# Patient Record
Sex: Female | Born: 1995 | Race: White | Hispanic: No | Smoking: Former smoker
Health system: Southern US, Community
[De-identification: ages and names within clinical notes are randomized; demographics above are authoritative.]

## PROBLEM LIST (undated history)

## (undated) DIAGNOSIS — F419 Anxiety disorder, unspecified: Secondary | ICD-10-CM

## (undated) DIAGNOSIS — F32A Depression, unspecified: Secondary | ICD-10-CM

## (undated) DIAGNOSIS — F329 Major depressive disorder, single episode, unspecified: Secondary | ICD-10-CM

## (undated) DIAGNOSIS — G43909 Migraine, unspecified, not intractable, without status migrainosus: Secondary | ICD-10-CM

## (undated) DIAGNOSIS — J9801 Acute bronchospasm: Secondary | ICD-10-CM

## (undated) HISTORY — PX: WISDOM TOOTH EXTRACTION: SHX21

## (undated) HISTORY — DX: Migraine, unspecified, not intractable, without status migrainosus: G43.909

## (undated) HISTORY — PX: DILATION AND CURETTAGE OF UTERUS: SHX78

## (undated) HISTORY — PX: TONSILLECTOMY: SUR1361

---
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
## 2014-04-05 ENCOUNTER — Encounter (HOSPITAL_BASED_OUTPATIENT_CLINIC_OR_DEPARTMENT_OTHER): Payer: Self-pay | Admitting: Emergency Medicine

## 2014-04-05 ENCOUNTER — Emergency Department (HOSPITAL_BASED_OUTPATIENT_CLINIC_OR_DEPARTMENT_OTHER)
Admission: EM | Admit: 2014-04-05 | Discharge: 2014-04-05 | Disposition: A | Discharge status: Home / Self Care | Payer: Medicaid - Out of State | Attending: Emergency Medicine | Admitting: Emergency Medicine

## 2014-04-05 DIAGNOSIS — Z8709 Personal history of other diseases of the respiratory system: Secondary | ICD-10-CM | POA: Insufficient documentation

## 2014-04-05 DIAGNOSIS — R197 Diarrhea, unspecified: Secondary | ICD-10-CM | POA: Insufficient documentation

## 2014-04-05 DIAGNOSIS — Z87891 Personal history of nicotine dependence: Secondary | ICD-10-CM | POA: Insufficient documentation

## 2014-04-05 DIAGNOSIS — Z3202 Encounter for pregnancy test, result negative: Secondary | ICD-10-CM | POA: Insufficient documentation

## 2014-04-05 DIAGNOSIS — Z79899 Other long term (current) drug therapy: Secondary | ICD-10-CM | POA: Insufficient documentation

## 2014-04-05 DIAGNOSIS — G43909 Migraine, unspecified, not intractable, without status migrainosus: Secondary | ICD-10-CM | POA: Insufficient documentation

## 2014-04-05 DIAGNOSIS — R42 Dizziness and giddiness: Secondary | ICD-10-CM | POA: Insufficient documentation

## 2014-04-05 LAB — URINALYSIS, ROUTINE W REFLEX MICROSCOPIC
Bilirubin Urine: NEGATIVE
Glucose, UA: NEGATIVE mg/dL
Hgb urine dipstick: NEGATIVE
Ketones, ur: NEGATIVE mg/dL
Leukocytes, UA: NEGATIVE
Nitrite: NEGATIVE
Protein, ur: NEGATIVE mg/dL
Specific Gravity, Urine: 1.007 (ref 1.005–1.030)
Urobilinogen, UA: 0.2 mg/dL (ref 0.0–1.0)
pH: 6.5 (ref 5.0–8.0)

## 2014-04-05 LAB — PREGNANCY, URINE: Preg Test, Ur: NEGATIVE

## 2014-04-05 MED ORDER — SODIUM CHLORIDE 0.9 % IV BOLUS (SEPSIS)
1000.0000 mL | Freq: Once | INTRAVENOUS | Status: AC
  Administered 2014-04-05: 1000 mL via INTRAVENOUS

## 2014-04-05 MED ORDER — DIPHENHYDRAMINE HCL 50 MG/ML IJ SOLN
25.0000 mg | Freq: Once | INTRAMUSCULAR | Status: AC
  Administered 2014-04-05: 25 mg via INTRAVENOUS
  Filled 2014-04-05: qty 1

## 2014-04-05 MED ORDER — METOCLOPRAMIDE HCL 5 MG/ML IJ SOLN
10.0000 mg | Freq: Once | INTRAMUSCULAR | Status: AC
  Administered 2014-04-05: 10 mg via INTRAVENOUS
  Filled 2014-04-05: qty 2

## 2014-04-05 MED ORDER — DEXAMETHASONE SODIUM PHOSPHATE 10 MG/ML IJ SOLN
10.0000 mg | Freq: Once | INTRAMUSCULAR | Status: AC
  Administered 2014-04-05: 10 mg via INTRAVENOUS
  Filled 2014-04-05: qty 1

## 2014-04-05 NOTE — ED Notes (Signed)
1.  Reports blurred vision when waking up this am.  Reports headache, nausea, continued blurred vision t/o the day even though she took a [employer], Aleve.  Reports onset of vomiting with a blood nose afterwards.  2.  Nexplanon placed 02/26/14 after a negative pregnancy test.  Had her period one week ago.  Concerned that some 'fleshy tissue' passed and she had some irregular bleeding.

## 2014-04-05 NOTE — ED Provider Notes (Signed)
CSN: [id number]     Arrival date & time 04/05/14  1522 History  This chart was scribed for Rolan Bucco, MD by Nicholos Johns, ED scribe. This patient was seen in room MH05/MH05 and the patient's care was started at 3:45 PM.   Chief Complaint  Patient presents with  . Headache   The history is provided by the patient. No language interpreter was used.   HPI Comments: Emily Briggs is a 18 y.o. female who presents to the Emergency Department complaining of HA located along the temporal and frontal lobes w/ associated blurred vision, nausea, and dizziness; onset this morning. Some intermittent diarrhea as well. States she woke up this morning and her vision was blurry. States this usually how she knows she is getting a migraine. Took a goody powder which normally provides relief in 15-20 minutes but states this did not provide relief. Went to work and tried to go about her day but still felt sick. Went to the bathroom and vomited. Went to wipe her mouth and nose and states there was blood everywhere. Bleeding stopped immediately after vomiting. States she usually does not vomit when she gets migraines. Pt received a Nexplanon implant in [place] 02/26/14 following a negative pregnancy test. States he had her period last week but was concerned after some fleshy tissue passed and bleeding was irregular. Denies any other recent trauma or injury. Denies abdominal pain, vaginal discharge, neck pain, fever, or  photophobia. Says that this feels like same type of pain as her typical migraines.  Past Medical History  Diagnosis Date  . Bronchial spasm    History reviewed. No pertinent past surgical history. No family history on file. History  Substance Use Topics  . Smoking status: Former Smoker    Quit date: 10/31/2011  . Smokeless tobacco: Not on file  . Alcohol Use: No   OB History   Grav Para Term Preterm Abortions TAB SAB Ect Mult Living                 Review of Systems  Constitutional:  Negative for fever, chills, diaphoresis and fatigue.  HENT: Negative for congestion, nosebleeds, rhinorrhea and sneezing.   Eyes: Positive for visual disturbance. Negative for photophobia.  Respiratory: Negative for cough, chest tightness and shortness of breath.   Cardiovascular: Negative for chest pain and leg swelling.  Gastrointestinal: Positive for nausea, vomiting and diarrhea. Negative for abdominal pain and blood in stool.  Genitourinary: Negative for frequency, hematuria, flank pain, vaginal discharge and difficulty urinating.  Musculoskeletal: Negative for arthralgias, back pain and neck pain.  Skin: Negative for rash.  Neurological: Positive for dizziness and headaches. Negative for speech difficulty, weakness and numbness.   Allergies  Sulfa drugs cross reactors  Home Medications   Prior to Admission medications   Medication Sig Start Date End Date Taking? Authorizing Provider  dextromethorphan (DELSYM) 30 MG/5ML liquid Take 60 mg by mouth 2 (two) times daily as needed. For cough    Historical Provider, MD  guaiFENesin (ROBITUSSIN) 100 MG/5ML SOLN Take 10 mLs by mouth every 4 (four) hours as needed. For cough/congestion    Historical Provider, MD   Triage Vitals: BP 125/63  Pulse 68  Temp(Src) 98.8 F (37.1 C) (Oral)  Resp 18  Ht 5\' 5"  (1.651 m)  Wt 130 lb (58.968 kg)  BMI 21.63 kg/m2  SpO2 100%  LMP 03/25/2014 Physical Exam  Nursing note and vitals reviewed. Constitutional: She is oriented to person, place, and time. She appears well-developed and  well-nourished.  HENT:  Head: Normocephalic and atraumatic.  Eyes: Pupils are equal, round, and reactive to light.  Fundi not well visualized. No photophobia.   Neck: Normal range of motion. Neck supple.  No neck stiffness.  Cardiovascular: Normal rate, regular rhythm and normal heart sounds.   Pulmonary/Chest: Effort normal and breath sounds normal. No respiratory distress. She has no wheezes. She has no rales. She  exhibits no tenderness.  Abdominal: Soft. Bowel sounds are normal. There is no tenderness. There is no rebound and no guarding.  Musculoskeletal: Normal range of motion. She exhibits no edema.  Lymphadenopathy:    She has no cervical adenopathy.  Neurological: She is alert and oriented to person, place, and time. She has normal strength. No cranial nerve deficit or sensory deficit.  Finger to nose intact. No pronator drift.  Skin: Skin is warm and dry. No rash noted.  Psychiatric: She has a normal mood and affect.   ED Course  Procedures (including critical care time) DIAGNOSTIC STUDIES: Oxygen Saturation is 100% on room air, normal by my interpretation.    COORDINATION OF CARE: At 3:48 PM: Discussed treatment plan with patient which includes migraine cocktail and IV fluids. Patient agrees.   Results for orders placed during the hospital encounter of 04/05/14  PREGNANCY, URINE      Result Value Ref Range   Preg Test, Ur NEGATIVE  NEGATIVE  URINALYSIS, ROUTINE W REFLEX MICROSCOPIC      Result Value Ref Range   Color, Urine YELLOW  YELLOW   APPearance CLEAR  CLEAR   Specific Gravity, Urine 1.007  1.005 - 1.030   pH 6.5  5.0 - 8.0   Glucose, UA NEGATIVE  NEGATIVE mg/dL   Hgb urine dipstick NEGATIVE  NEGATIVE   Bilirubin Urine NEGATIVE  NEGATIVE   Ketones, ur NEGATIVE  NEGATIVE mg/dL   Protein, ur NEGATIVE  NEGATIVE mg/dL   Urobilinogen, UA 0.2  0.0 - 1.0 mg/dL   Nitrite NEGATIVE  NEGATIVE   Leukocytes, UA NEGATIVE  NEGATIVE   Labs Review Labs Reviewed  PREGNANCY, URINE  URINALYSIS, ROUTINE W REFLEX MICROSCOPIC    Imaging Review No results found.   EKG Interpretation None      MDM   Final diagnoses:  Migraine   Patient was given migraine cocktail. Her headache is completely resolved after this. She has no symptoms that would be more suggestive of subarachnoid hemorrhage or meningitis. Her urine pregnancy is negative. She was discharged in good condition. I gave her  an outpatient referral to follow up with neurology for migraines continue. I advised to return here if she has any worsening symptoms or unusual headaches.  I personally performed the services described in this documentation, which was scribed in my presence.  The recorded information has been reviewed and considered.      Rolan BuccoMelanie Lashya Passe, MD 04/05/14 [phone]

## 2014-04-05 NOTE — Discharge Instructions (Signed)
Migraine Headache A migraine headache is an intense, throbbing pain on one or both sides of your head. A migraine can last for 30 minutes to several hours. CAUSES  The exact cause of a migraine headache is not always known. However, a migraine may be caused when nerves in the brain become irritated and release chemicals that cause inflammation. This causes pain. Certain things may also trigger migraines, such as:  Alcohol.  Smoking.  Stress.  Menstruation.  Aged cheeses.  Foods or drinks that contain nitrates, glutamate, aspartame, or tyramine.  Lack of sleep.  Chocolate.  Caffeine.  Hunger.  Physical exertion.  Fatigue.  Medicines used to treat chest pain (nitroglycerine), birth control pills, estrogen, and some blood pressure medicines. SIGNS AND SYMPTOMS  Pain on one or both sides of your head.  Pulsating or throbbing pain.  Severe pain that prevents daily activities.  Pain that is aggravated by any physical activity.  Nausea, vomiting, or both.  Dizziness.  Pain with exposure to bright lights, loud noises, or activity.  General sensitivity to bright lights, loud noises, or smells. Before you get a migraine, you may get warning signs that a migraine is coming (aura). An aura may include:  Seeing flashing lights.  Seeing bright spots, halos, or zig-zag lines.  Having tunnel vision or blurred vision.  Having feelings of numbness or tingling.  Having trouble talking.  Having muscle weakness. DIAGNOSIS  A migraine headache is often diagnosed based on:  Symptoms.  Physical exam.  A CT scan or MRI of your head. These imaging tests cannot diagnose migraines, but they can help rule out other causes of headaches. TREATMENT Medicines may be given for pain and nausea. Medicines can also be given to help prevent recurrent migraines.  HOME CARE INSTRUCTIONS  Only take over-the-counter or prescription medicines for pain or discomfort as directed by your  health care provider. The use of long-term narcotics is not recommended.  Lie down in a dark, quiet room when you have a migraine.  Keep a journal to find out what may trigger your migraine headaches. For example, write down:  What you eat and drink.  How much sleep you get.  Any change to your diet or medicines.  Limit alcohol consumption.  Quit smoking if you smoke.  Get 7 9 hours of sleep, or as recommended by your health care provider.  Limit stress.  Keep lights dim if bright lights bother you and make your migraines worse. SEEK IMMEDIATE MEDICAL CARE IF:   Your migraine becomes severe.  You have a fever.  You have a stiff neck.  You have vision loss.  You have muscular weakness or loss of muscle control.  You start losing your balance or have trouble walking.  You feel faint or pass out.  You have severe symptoms that are different from your first symptoms. MAKE SURE YOU:   Understand these instructions.  Will watch your condition.  Will get help right away if you are not doing well or get worse. Document Released: 10/31/2005 Document Revised: 08/21/2013 Document Reviewed: 07/08/2013 [employer] Patient Information 2014 [employer].  

## 2014-05-20 ENCOUNTER — Emergency Department (HOSPITAL_BASED_OUTPATIENT_CLINIC_OR_DEPARTMENT_OTHER)
Admission: EM | Admit: 2014-05-20 | Discharge: 2014-05-20 | Disposition: A | Discharge status: Home / Self Care | Payer: Medicaid - Out of State | Attending: Emergency Medicine | Admitting: Emergency Medicine

## 2014-05-20 ENCOUNTER — Emergency Department (HOSPITAL_BASED_OUTPATIENT_CLINIC_OR_DEPARTMENT_OTHER): Payer: Medicaid - Out of State

## 2014-05-20 ENCOUNTER — Encounter (HOSPITAL_BASED_OUTPATIENT_CLINIC_OR_DEPARTMENT_OTHER): Payer: Self-pay | Admitting: Emergency Medicine

## 2014-05-20 DIAGNOSIS — J40 Bronchitis, not specified as acute or chronic: Secondary | ICD-10-CM

## 2014-05-20 DIAGNOSIS — R059 Cough, unspecified: Secondary | ICD-10-CM | POA: Diagnosis present

## 2014-05-20 DIAGNOSIS — F17200 Nicotine dependence, unspecified, uncomplicated: Secondary | ICD-10-CM | POA: Insufficient documentation

## 2014-05-20 DIAGNOSIS — R05 Cough: Secondary | ICD-10-CM | POA: Diagnosis present

## 2014-05-20 DIAGNOSIS — N390 Urinary tract infection, site not specified: Secondary | ICD-10-CM | POA: Insufficient documentation

## 2014-05-20 DIAGNOSIS — Z3202 Encounter for pregnancy test, result negative: Secondary | ICD-10-CM | POA: Insufficient documentation

## 2014-05-20 DIAGNOSIS — J209 Acute bronchitis, unspecified: Secondary | ICD-10-CM | POA: Diagnosis not present

## 2014-05-20 LAB — URINALYSIS, ROUTINE W REFLEX MICROSCOPIC
Bilirubin Urine: NEGATIVE
Glucose, UA: NEGATIVE mg/dL
Hgb urine dipstick: NEGATIVE
Ketones, ur: NEGATIVE mg/dL
Leukocytes, UA: NEGATIVE
Nitrite: POSITIVE — AB
Protein, ur: NEGATIVE mg/dL
Specific Gravity, Urine: 1.03 (ref 1.005–1.030)
Urobilinogen, UA: 0.2 mg/dL (ref 0.0–1.0)
pH: 5.5 (ref 5.0–8.0)

## 2014-05-20 LAB — URINE MICROSCOPIC-ADD ON

## 2014-05-20 LAB — PREGNANCY, URINE: Preg Test, Ur: NEGATIVE

## 2014-05-20 MED ORDER — ALBUTEROL SULFATE HFA 108 (90 BASE) MCG/ACT IN AERS: 1.0000 | INHALATION_SPRAY | Freq: Four times a day (QID) | RESPIRATORY_TRACT | Status: DC | PRN

## 2014-05-20 MED ORDER — CEPHALEXIN 250 MG PO CAPS
500.0000 mg | ORAL_CAPSULE | Freq: Once | ORAL | Status: AC
  Administered 2014-05-20: 500 mg via ORAL
  Filled 2014-05-20: qty 2

## 2014-05-20 MED ORDER — CEPHALEXIN 500 MG PO CAPS
500.0000 mg | ORAL_CAPSULE | Freq: Four times a day (QID) | ORAL | Status: DC
Start: 2014-05-20 — End: 2014-09-22

## 2014-05-20 NOTE — Discharge Instructions (Signed)
Bronchitis Bronchitis is inflammation of the airways that extend from the windpipe into the lungs (bronchi). The inflammation often causes mucus to develop, which leads to a cough. If the inflammation becomes severe, it may cause shortness of breath. CAUSES  Bronchitis may be caused by:   Viral infections.   Bacteria.   Cigarette smoke.   Allergens, pollutants, and other irritants.  SIGNS AND SYMPTOMS  The most common symptom of bronchitis is a frequent cough that produces mucus. Other symptoms include:  Fever.   Body aches.   Chest congestion.   Chills.   Shortness of breath.   Sore throat.  DIAGNOSIS  Bronchitis is usually diagnosed through a medical history and physical exam. Tests, such as chest X-rays, are sometimes done to rule out other conditions.  TREATMENT  You may need to avoid contact with whatever caused the problem (smoking, for example). Medicines are sometimes needed. These may include:  Antibiotics. These may be prescribed if the condition is caused by bacteria.  Cough suppressants. These may be prescribed for relief of cough symptoms.   Inhaled medicines. These may be prescribed to help open your airways and make it easier for you to breathe.   Steroid medicines. These may be prescribed for those with recurrent (chronic) bronchitis. HOME CARE INSTRUCTIONS  Get plenty of rest.   Drink enough fluids to keep your urine clear or pale yellow (unless you have a medical condition that requires fluid restriction). Increasing fluids may help thin your secretions and will prevent dehydration.   Only take over-the-counter or prescription medicines as directed by your health care provider.  Only take antibiotics as directed. Make sure you finish them even if you start to feel better.  Avoid secondhand smoke, irritating chemicals, and strong fumes. These will make bronchitis worse. If you are a smoker, quit smoking. Consider using nicotine gum or  skin patches to help control withdrawal symptoms. Quitting smoking will help your lungs heal faster.   Put a cool-mist humidifier in your bedroom at night to moisten the air. This may help loosen mucus. Change the water in the humidifier daily. You can also run the hot water in your shower and sit in the bathroom with the door closed for 5-10 minutes.   Follow up with your health care provider as directed.   Wash your hands frequently to avoid catching bronchitis again or spreading an infection to others.  SEEK MEDICAL CARE IF: Your symptoms do not improve after 1 week of treatment.  SEEK IMMEDIATE MEDICAL CARE IF:  Your fever increases.  You have chills.   You have chest pain.   You have worsening shortness of breath.   You have bloody sputum.  You faint.  You have lightheadedness.  You have a severe headache.   You vomit repeatedly. MAKE SURE YOU:   Understand these instructions.  Will watch your condition.  Will get help right away if you are not doing well or get worse. Document Released: 10/31/2005 Document Revised: 08/21/2013 Document Reviewed: 06/25/2013 [facility] Patient Information 2015 [place], [place]. This information is not intended to replace advice given to you by your health care provider. Make sure you discuss any questions you have with your health care provider.  For the urinary tract infection take the Keflex as directed. This may also help for cough. Use albuterol inhaler 2 puffs every 6 hours for the next 7 days then as needed. Okay to continue Mucinex DM. Would recommend a trial of Zyrtec in case this is allergy  base for the cough. Chest x-ray was negative.

## 2014-05-20 NOTE — ED Provider Notes (Signed)
CSN: [id number]     Arrival date & time 05/20/14  1014 History   First MD Initiated Contact with Patient 05/20/14 1037     Chief Complaint  Patient presents with  . Cough     (Consider location/radiation/quality/duration/timing/severity/associated sxs/prior Treatment) Patient is a 18 y.o. female presenting with cough. The history is provided by the patient.  Cough Associated symptoms: chest pain   Associated symptoms: no fever, no headaches and no sore throat    patient with a two-week history of cold and cough. One week history of dysuria. Patient's been using Mucinex and Robitussin. No significant improvement in the cough. No fevers. No nausea vomiting no bowel pain. Chest soreness due to coughing.  Past Medical History  Diagnosis Date  . Bronchial spasm    Past Surgical History  Procedure Laterality Date  . Tonsillectomy     No family history on file. History  Substance Use Topics  . Smoking status: Current Every Day Smoker -- 0.50 packs/day    Types: Cigarettes    Last Attempt to Quit: 10/31/2011  . Smokeless tobacco: Not on file  . Alcohol Use: No   OB History   Grav Para Term Preterm Abortions TAB SAB Ect Mult Living                 Review of Systems  Constitutional: Negative for fever.  HENT: Positive for congestion. Negative for sore throat.   Eyes: Negative for visual disturbance.  Respiratory: Positive for cough.   Cardiovascular: Positive for chest pain.  Gastrointestinal: Negative for nausea, vomiting and abdominal pain.  Genitourinary: Positive for dysuria.  Musculoskeletal: Negative for back pain.  Neurological: Negative for headaches.  Hematological: Does not bruise/bleed easily.  Psychiatric/Behavioral: Negative for confusion.      Allergies  Sulfa drugs cross reactors  Home Medications   Prior to Admission medications   Medication Sig Start Date End Date Taking? Authorizing Provider  albuterol (PROVENTIL HFA;VENTOLIN HFA) 108 (90 BASE)  MCG/ACT inhaler Inhale 1-2 puffs into the lungs every 6 (six) hours as needed for wheezing or shortness of breath. 05/20/14   Vanetta MuldersScott Hamdi Kley, MD  cephALEXin (KEFLEX) 500 MG capsule Take 1 capsule (500 mg total) by mouth 4 (four) times daily. 05/20/14   Vanetta MuldersScott Ramil Edgington, MD  dextromethorphan (DELSYM) 30 MG/5ML liquid Take 60 mg by mouth 2 (two) times daily as needed. For cough    Historical Provider, MD  guaiFENesin (ROBITUSSIN) 100 MG/5ML SOLN Take 10 mLs by mouth every 4 (four) hours as needed. For cough/congestion    Historical Provider, MD   BP 124/69  Pulse 54  Temp(Src) 98.9 F (37.2 C) (Oral)  Resp 16  Ht 5\' 5"  (1.651 m)  Wt 135 lb (61.236 kg)  BMI 22.47 kg/m2  SpO2 97% Physical Exam  Nursing note and vitals reviewed. Constitutional: She is oriented to person, place, and time. She appears well-developed and well-nourished. No distress.  HENT:  Head: Normocephalic and atraumatic.  Mouth/Throat: Oropharynx is clear and moist. No oropharyngeal exudate.  Eyes: Conjunctivae and EOM are normal. Pupils are equal, round, and reactive to light.  Neck: Normal range of motion. Neck supple.  Cardiovascular: Normal rate, regular rhythm and normal heart sounds.   Pulmonary/Chest: Effort normal and breath sounds normal. No respiratory distress.  Abdominal: Soft. Bowel sounds are normal. There is no tenderness.  Musculoskeletal: Normal range of motion. She exhibits no edema.  Neurological: She is alert and oriented to person, place, and time. No cranial nerve deficit. She exhibits  normal muscle tone. Coordination normal.  Skin: Skin is warm. No rash noted.    ED Course  Procedures (including critical care time) Labs Review Labs Reviewed  URINALYSIS, ROUTINE W REFLEX MICROSCOPIC - Abnormal; Notable for the following:    APPearance CLOUDY (*)    Nitrite POSITIVE (*)    All other components within normal limits  URINE MICROSCOPIC-ADD ON - Abnormal; Notable for the following:    Squamous  Epithelial / LPF FEW (*)    Bacteria, UA MANY (*)    All other components within normal limits  PREGNANCY, URINE   Results for orders placed during the hospital encounter of 05/20/14  PREGNANCY, URINE      Result Value Ref Range   Preg Test, Ur NEGATIVE  NEGATIVE  URINALYSIS, ROUTINE W REFLEX MICROSCOPIC      Result Value Ref Range   Color, Urine YELLOW  YELLOW   APPearance CLOUDY (*) CLEAR   Specific Gravity, Urine 1.030  1.005 - 1.030   pH 5.5  5.0 - 8.0   Glucose, UA NEGATIVE  NEGATIVE mg/dL   Hgb urine dipstick NEGATIVE  NEGATIVE   Bilirubin Urine NEGATIVE  NEGATIVE   Ketones, ur NEGATIVE  NEGATIVE mg/dL   Protein, ur NEGATIVE  NEGATIVE mg/dL   Urobilinogen, UA 0.2  0.0 - 1.0 mg/dL   Nitrite POSITIVE (*) NEGATIVE   Leukocytes, UA NEGATIVE  NEGATIVE  URINE MICROSCOPIC-ADD ON      Result Value Ref Range   Squamous Epithelial / LPF FEW (*) RARE   WBC, UA 7-10  <3 WBC/hpf   RBC / HPF 0-2  <3 RBC/hpf   Bacteria, UA MANY (*) RARE     Imaging Review Dg Chest 2 View  05/20/2014   CLINICAL DATA:  Cough and congestion.  Smoker.  EXAM: CHEST  2 VIEW  COMPARISON:  12/01/2011.  FINDINGS: No infiltrate, congestive heart failure or pneumothorax.  Minimal curvature thoracic -lumbar spine may be related to positioning rather than scoliosis.  Mediastinal and cardiac silhouette within normal limits.  IMPRESSION: No acute abnormality.   Electronically Signed   By: Bridgett LarssonSteve  Olson M.D.   On: 05/20/2014 11:08     EKG Interpretation None      MDM   Final diagnoses:  UTI (lower urinary tract infection)  Bronchitis    Pregnancy test negative urinalysis consistent with urinary tract infection. Will treat with Keflex. Chest x-ray negative for pneumonia pulmonary edema or pneumothorax. Symptoms the in the chest are most likely related to a bronchitis. Keflex antibiotic may help that some. Patient will continue her Mucinex DM patient will also continue her Robitussin-DM and we will get  prescription for albuterol inhaler to go along with treatment for the bronchitis. Also recommended patient to trial of Zyrtec in case this is allergy base.    Vanetta MuldersScott Jaiveer Panas, MD 05/20/14 1144

## 2014-05-20 NOTE — ED Notes (Signed)
Cough and cold symptoms x 2 weeks unrelieved after taking Mucinex and Robitussin. Dysuria x 1 week.

## 2014-09-22 ENCOUNTER — Emergency Department (HOSPITAL_BASED_OUTPATIENT_CLINIC_OR_DEPARTMENT_OTHER)
Admission: EM | Admit: 2014-09-22 | Discharge: 2014-09-22 | Disposition: A | Discharge status: Home / Self Care | Payer: PRIVATE HEALTH INSURANCE | Attending: Emergency Medicine | Admitting: Emergency Medicine

## 2014-09-22 DIAGNOSIS — N390 Urinary tract infection, site not specified: Secondary | ICD-10-CM | POA: Insufficient documentation

## 2014-09-22 DIAGNOSIS — Z720 Tobacco use: Secondary | ICD-10-CM | POA: Insufficient documentation

## 2014-09-22 DIAGNOSIS — Z8709 Personal history of other diseases of the respiratory system: Secondary | ICD-10-CM | POA: Diagnosis not present

## 2014-09-22 DIAGNOSIS — Z792 Long term (current) use of antibiotics: Secondary | ICD-10-CM | POA: Diagnosis not present

## 2014-09-22 DIAGNOSIS — M549 Dorsalgia, unspecified: Secondary | ICD-10-CM | POA: Diagnosis present

## 2014-09-22 DIAGNOSIS — Z79899 Other long term (current) drug therapy: Secondary | ICD-10-CM | POA: Diagnosis not present

## 2014-09-22 DIAGNOSIS — Z3202 Encounter for pregnancy test, result negative: Secondary | ICD-10-CM | POA: Insufficient documentation

## 2014-09-22 LAB — URINE MICROSCOPIC-ADD ON

## 2014-09-22 LAB — URINALYSIS, ROUTINE W REFLEX MICROSCOPIC
Bilirubin Urine: NEGATIVE
Glucose, UA: NEGATIVE mg/dL
Ketones, ur: NEGATIVE mg/dL
Leukocytes, UA: NEGATIVE
Nitrite: POSITIVE — AB
Protein, ur: NEGATIVE mg/dL
Specific Gravity, Urine: 1.014 (ref 1.005–1.030)
Urobilinogen, UA: 0.2 mg/dL (ref 0.0–1.0)
pH: 5.5 (ref 5.0–8.0)

## 2014-09-22 LAB — PREGNANCY, URINE: Preg Test, Ur: NEGATIVE

## 2014-09-22 MED ORDER — ONDANSETRON 4 MG PO TBDP: 4.0000 mg | ORAL_TABLET | Freq: Three times a day (TID) | ORAL | Status: DC | PRN

## 2014-09-22 MED ORDER — ONDANSETRON 4 MG PO TBDP
4.0000 mg | ORAL_TABLET | Freq: Once | ORAL | Status: AC
  Administered 2014-09-22: 4 mg via ORAL
  Filled 2014-09-22: qty 1

## 2014-09-22 MED ORDER — NAPROXEN 500 MG PO TABS: 500.0000 mg | ORAL_TABLET | Freq: Two times a day (BID) | ORAL | Status: DC

## 2014-09-22 MED ORDER — CEPHALEXIN 250 MG PO CAPS
500.0000 mg | ORAL_CAPSULE | Freq: Once | ORAL | Status: AC
  Administered 2014-09-22: 500 mg via ORAL
  Filled 2014-09-22: qty 2

## 2014-09-22 MED ORDER — CEPHALEXIN 500 MG PO CAPS: 500.0000 mg | ORAL_CAPSULE | Freq: Two times a day (BID) | ORAL | Status: DC

## 2014-09-22 MED ORDER — IBUPROFEN 800 MG PO TABS
800.0000 mg | ORAL_TABLET | Freq: Once | ORAL | Status: AC
  Administered 2014-09-22: 800 mg via ORAL
  Filled 2014-09-22: qty 1

## 2014-09-22 MED ORDER — TRAMADOL HCL 50 MG PO TABS
50.0000 mg | ORAL_TABLET | Freq: Once | ORAL | Status: AC
  Administered 2014-09-22: 50 mg via ORAL
  Filled 2014-09-22: qty 1

## 2014-09-22 MED ORDER — PHENAZOPYRIDINE HCL 95 MG PO TABS: 95.0000 mg | ORAL_TABLET | Freq: Three times a day (TID) | ORAL | Status: DC | PRN

## 2014-09-22 NOTE — ED Provider Notes (Signed)
TIME SEEN: 8:50 PM  CHIEF COMPLAINT:back pain  HPI:  HPI Comments: Emily Briggs is a 18 y.o. female with no significant past medical history who presents to the Emergency Department complaining of back pain and dysuria for the past 3 days. Denies any injury attack. No numbness, tingling or focal weakness. Pt states laying down makes the pain worse. She has tried icy hot packs, ibuprofen which have not provided any relief. Pt is sexually active with one partner. She is currently on her menstrual cycle. No vaginal discharge or history of STDs. No fevers, chills, nausea, vomiting or diarrhea.  ROS: See HPI Constitutional: no fever  Eyes: no drainage  ENT: no runny nose   Cardiovascular:  no chest pain  Resp: no SOB  GI: no vomiting NG:[id number]:dysuria, denies vaginal discharge Integumentary: no rash  Allergy: no hives  Musculoskeletal: no leg swelling, back pain Neurological: no slurred speech ROS otherwise negative  PAST MEDICAL HISTORY/PAST SURGICAL HISTORY:  Past Medical History  Diagnosis Date  . Bronchial spasm     MEDICATIONS:  Prior to Admission medications   Medication Sig Start Date End Date Taking? Authorizing Provider  albuterol (PROVENTIL HFA;VENTOLIN HFA) 108 (90 BASE) MCG/ACT inhaler Inhale 1-2 puffs into the lungs every 6 (six) hours as needed for wheezing or shortness of breath. 05/20/14   Vanetta MuldersScott Zackowski, MD  cephALEXin (KEFLEX) 500 MG capsule Take 1 capsule (500 mg total) by mouth 4 (four) times daily. 05/20/14   Vanetta MuldersScott Zackowski, MD  dextromethorphan (DELSYM) 30 MG/5ML liquid Take 60 mg by mouth 2 (two) times daily as needed. For cough    Historical Provider, MD  guaiFENesin (ROBITUSSIN) 100 MG/5ML SOLN Take 10 mLs by mouth every 4 (four) hours as needed. For cough/congestion    Historical Provider, MD    ALLERGIES:  Allergies  Allergen Reactions  . Sulfa Drugs Cross Reactors Other (See Comments)    "migraine"    SOCIAL HISTORY:  History  Substance Use Topics  .  Smoking status: Current Every Day Smoker -- 0.50 packs/day    Types: Cigarettes    Last Attempt to Quit: 10/31/2011  . Smokeless tobacco: Not on file  . Alcohol Use: No    FAMILY HISTORY: No family history on file.  EXAM: BP 120/80 mmHg  Pulse 84  Temp(Src) 98.8 F (37.1 C) (Oral)  Resp 18  Ht 5\' 5"  (1.651 m)  Wt 135 lb (61.236 kg)  BMI 22.47 kg/m2  SpO2 98% CONSTITUTIONAL: Alert and oriented and responds appropriately to questions. Well-appearing; well-nourished; smiling, pleasant, nontoxic HEAD: Normocephalic EYES: Conjunctivae clear, PERRL ENT: normal nose; no rhinorrhea; moist mucous membranes; pharynx without lesions noted NECK: Supple, no meningismus, no LAD  CARD: RRR; S1 and S2 appreciated; no murmurs, no clicks, no rubs, no gallops RESP: Normal chest excursion without splinting or tachypnea; breath sounds clear and equal bilaterally; no wheezes, no rhonchi, no rales,  ABD/GI: Normal bowel sounds; non-distended; soft, non-tender, no rebound, no guarding BACK:  The back appears normal and is non-tender to palpation, there is no CVA tenderness; no midline spinal tenderness or step-off or deformity EXT: Normal ROM in all joints; non-tender to palpation; no edema; normal capillary refill; no cyanosis    SKIN: Normal color for age and race; warm NEURO: Moves all extremities equally; sensation to light touch intact diffusely, cranial nerves II through XII intact, normal gait PSYCH: The patient's mood and manner are appropriate. Grooming and personal hygiene are appropriate.  MEDICAL DECISION MAKING: Pt here with dysuria and lower  back pain. She does have a nitrite positive urinary tract infection. Culture pending. We'll discharge home on Keflex. We'll discharge with naproxen, Pyridium, Zofran. Discussed return precautions. Discussed supportive care instructions. She is very well-appearing without flank pain. No signs or symptoms to suggest pyelonephritis.    I personally  performed the services described in this documentation, which was scribed in my presence. The recorded information has been reviewed and is accurate.      [name], DO 09/22/14 2139

## 2014-09-22 NOTE — ED Notes (Signed)
Back pain and dysuria x 3 days.

## 2014-09-22 NOTE — Discharge Instructions (Signed)

## 2014-09-25 LAB — URINE CULTURE: Colony Count: >=100000

## 2014-09-26 ENCOUNTER — Telehealth (HOSPITAL_BASED_OUTPATIENT_CLINIC_OR_DEPARTMENT_OTHER): Payer: Self-pay | Admitting: Emergency Medicine

## 2014-09-26 NOTE — Telephone Encounter (Signed)
Post ED Visit - Positive Culture Follow-up  Culture report reviewed by antimicrobial stewardship pharmacist: []  Emily Briggs, Pharm.D., BCPS [x]  Emily MiyamotoJeremy Briggs, [address] BCPS []  Emily PillionElizabeth Briggs, Pharm.D., BCPS []  Fair PlayMinh Briggs, [address] BCPS, AAHIVP []  Emily HuskMichelle Briggs, Pharm.D., BCPS, AAHIVP []  Emily BertinHaley Briggs, [address]   Positive urine culture E. Coli Treated with cephalexin, organism sensitive to the same and no further patient follow-up is required at this time.  Emily MullMiller, Emily Briggs 09/26/2014, 3:05 PM

## 2014-12-16 ENCOUNTER — Encounter (HOSPITAL_COMMUNITY): Payer: Self-pay

## 2014-12-16 ENCOUNTER — Emergency Department (HOSPITAL_COMMUNITY)
Admission: EM | Admit: 2014-12-16 | Discharge: 2014-12-16 | Disposition: A | Discharge status: Home / Self Care | Payer: Medicaid - Out of State | Attending: Emergency Medicine | Admitting: Emergency Medicine

## 2014-12-16 DIAGNOSIS — R300 Dysuria: Secondary | ICD-10-CM | POA: Diagnosis present

## 2014-12-16 DIAGNOSIS — Z3202 Encounter for pregnancy test, result negative: Secondary | ICD-10-CM | POA: Insufficient documentation

## 2014-12-16 DIAGNOSIS — F419 Anxiety disorder, unspecified: Secondary | ICD-10-CM | POA: Insufficient documentation

## 2014-12-16 DIAGNOSIS — R4584 Anhedonia: Secondary | ICD-10-CM

## 2014-12-16 DIAGNOSIS — Z8709 Personal history of other diseases of the respiratory system: Secondary | ICD-10-CM | POA: Diagnosis not present

## 2014-12-16 DIAGNOSIS — F32A Depression, unspecified: Secondary | ICD-10-CM

## 2014-12-16 DIAGNOSIS — Z720 Tobacco use: Secondary | ICD-10-CM | POA: Insufficient documentation

## 2014-12-16 DIAGNOSIS — F329 Major depressive disorder, single episode, unspecified: Secondary | ICD-10-CM | POA: Insufficient documentation

## 2014-12-16 LAB — URINALYSIS, ROUTINE W REFLEX MICROSCOPIC
Bilirubin Urine: NEGATIVE
Glucose, UA: NEGATIVE mg/dL
Hgb urine dipstick: NEGATIVE
Ketones, ur: NEGATIVE mg/dL
Leukocytes, UA: NEGATIVE
Nitrite: NEGATIVE
Protein, ur: NEGATIVE mg/dL
Specific Gravity, Urine: 1.004 — ABNORMAL LOW (ref 1.005–1.030)
Urobilinogen, UA: 0.2 mg/dL (ref 0.0–1.0)
pH: 6.5 (ref 5.0–8.0)

## 2014-12-16 LAB — POC URINE PREG, ED: Preg Test, Ur: NEGATIVE

## 2014-12-16 MED ORDER — ALBUTEROL SULFATE HFA 108 (90 BASE) MCG/ACT IN AERS: 1.0000 | INHALATION_SPRAY | Freq: Four times a day (QID) | RESPIRATORY_TRACT | Status: DC | PRN

## 2014-12-16 NOTE — Discharge Instructions (Signed)
Depression °Depression refers to feeling sad, low, down in the dumps, blue, gloomy, or empty. In general, there are two kinds of depression: °· Normal sadness or normal grief. This kind of depression is one that we all feel from time to time after upsetting life experiences, such as the loss of a job or the ending of a relationship. This kind of depression is considered normal, is short lived, and resolves within a few days to 2 weeks. Depression experienced after the loss of a loved one (bereavement) often lasts longer than 2 weeks but normally gets better with time. °· Clinical depression. This kind of depression lasts longer than normal sadness or normal grief or interferes with your ability to function at home, at work, and in school. It also interferes with your personal relationships. It affects almost every aspect of your life. Clinical depression is an illness. °Symptoms of depression can also be caused by conditions other than those mentioned above, such as: °· Physical illness. Some physical illnesses, including underactive thyroid gland (hypothyroidism), severe anemia, specific types of cancer, diabetes, uncontrolled seizures, heart and lung problems, strokes, and chronic pain are commonly associated with symptoms of depression. °· Side effects of some prescription medicine. In some people, certain types of medicine can cause symptoms of depression. °· Substance abuse. Abuse of alcohol and illicit drugs can cause symptoms of depression. °SYMPTOMS °Symptoms of normal sadness and normal grief include the following: °· Feeling sad or crying for short periods of time. °· Not caring about anything (apathy). °· Difficulty sleeping or sleeping too much. °· No longer able to enjoy the things you used to enjoy. °· Desire to be by oneself all the time (social isolation). °· Lack of energy or motivation. °· Difficulty concentrating or remembering. °· Change in appetite or weight. °· Restlessness or  agitation. °Symptoms of clinical depression include the same symptoms of normal sadness or normal grief and also the following symptoms: °· Feeling sad or crying all the time. °· Feelings of guilt or worthlessness. °· Feelings of hopelessness or helplessness. °· Thoughts of suicide or the desire to harm yourself (suicidal ideation). °· Loss of touch with reality (psychotic symptoms). Seeing or hearing things that are not real (hallucinations) or having false beliefs about your life or the people around you (delusions and paranoia). °DIAGNOSIS  °The diagnosis of clinical depression is usually based on how bad the symptoms are and how long they have lasted. Your health care provider will also ask you questions about your medical history and substance use to find out if physical illness, use of prescription medicine, or substance abuse is causing your depression. Your health care provider may also order blood tests. °TREATMENT  °Often, normal sadness and normal grief do not require treatment. However, sometimes antidepressant medicine is given for bereavement to ease the depressive symptoms until they resolve. °The treatment for clinical depression depends on how bad the symptoms are but often includes antidepressant medicine, counseling with a mental health professional, or both. Your health care provider will help to determine what treatment is best for you. °Depression caused by physical illness usually goes away with appropriate medical treatment of the illness. If prescription medicine is causing depression, talk with your health care provider about stopping the medicine, decreasing the dose, or changing to another medicine. °Depression caused by the abuse of alcohol or illicit drugs goes away when you stop using these substances. Some adults need professional help in order to stop drinking or using drugs. °SEEK IMMEDIATE MEDICAL   CARE IF:  You have thoughts about hurting yourself or others.  You lose touch  with reality (have psychotic symptoms).  You are taking medicine for depression and have a serious side effect. FOR MORE INFORMATION  National Alliance on Mental Illness: www.nami.[employer]org  National Institute of Mental Health: http://www.maynard.net/www.nimh.nih.gov Document Released: 10/28/2000 Document Revised: 03/17/2014 Document Reviewed: 01/30/2012 [facility] Patient Information 2015 [place], [place]. This information is not intended to replace advice given to you by your health care provider. Make sure you discuss any questions you have with your health care provider. Bronchospasm A bronchospasm is a spasm or tightening of the airways going into the lungs. During a bronchospasm breathing becomes more difficult because the airways get smaller. When this happens there can be coughing, a whistling sound when breathing (wheezing), and difficulty breathing. Bronchospasm is often associated with asthma, but not all patients who experience a bronchospasm have asthma. CAUSES  A bronchospasm is caused by inflammation or irritation of the airways. The inflammation or irritation may be triggered by:   Allergies (such as to animals, pollen, food, or mold). Allergens that cause bronchospasm may cause wheezing immediately after exposure or many hours later.   Infection. Viral infections are believed to be the most common cause of bronchospasm.   Exercise.   Irritants (such as pollution, cigarette smoke, strong odors, aerosol sprays, and paint fumes).   Weather changes. Winds increase molds and pollens in the air. Rain refreshes the air by washing irritants out. Cold air may cause inflammation.   Stress and emotional upset.  SIGNS AND SYMPTOMS   Wheezing.   Excessive nighttime coughing.   Frequent or severe coughing with a simple cold.   Chest tightness.   Shortness of breath.  DIAGNOSIS  Bronchospasm is usually diagnosed through a history and physical exam. Tests, such as chest X-rays, are sometimes done  to look for other conditions. TREATMENT   Inhaled medicines can be given to open up your airways and help you breathe. The medicines can be given using either an inhaler or a nebulizer machine.  Corticosteroid medicines may be given for severe bronchospasm, usually when it is associated with asthma. HOME CARE INSTRUCTIONS   Always have a plan prepared for seeking medical care. Know when to call your health care provider and local emergency services (911 in the [place] Know where you can access local emergency care.  Only take medicines as directed by your health care provider.  If you were prescribed an inhaler or nebulizer machine, ask your health care provider to explain how to use it correctly. Always use a spacer with your inhaler if you were given one.  It is necessary to remain calm during an attack. Try to relax and breathe more slowly.  Control your home environment in the following ways:   Change your heating and air conditioning filter at least once a month.   Limit your use of fireplaces and wood stoves.  Do not smoke and do not allow smoking in your home.   Avoid exposure to perfumes and fragrances.   Get rid of pests (such as roaches and mice) and their droppings.   Throw away plants if you see mold on them.   Keep your house clean and dust free.   Replace carpet with wood, tile, or vinyl flooring. Carpet can trap dander and dust.   Use allergy-proof pillows, mattress covers, and box spring covers.   Wash bed sheets and blankets every week in hot water and dry them in a dryer.  Use blankets that are made of polyester or cotton.   Wash hands frequently. SEEK MEDICAL CARE IF:   You have muscle aches.   You have chest pain.   The sputum changes from clear or white to yellow, green, gray, or bloody.   The sputum you cough up gets thicker.   There are problems that may be related to the medicine you are given, such as a rash, itching,  swelling, or trouble breathing.  SEEK IMMEDIATE MEDICAL CARE IF:   You have worsening wheezing and coughing even after taking your prescribed medicines.   You have increased difficulty breathing.   You develop severe chest pain. MAKE SURE YOU:   Understand these instructions.  Will watch your condition.  Will get help right away if you are not doing well or get worse. Document Released: 11/03/2003 Document Revised: 11/05/2013 Document Reviewed: 04/22/2013 [facility] Patient Information 2015 [place], [place]. This information is not intended to replace advice given to you by your health care provider. Make sure you discuss any questions you have with your health care provider.

## 2014-12-16 NOTE — ED Provider Notes (Signed)
CSN: [id number]     Arrival date & time 12/16/14  0941 History   First MD Initiated Contact with Patient 12/16/14 1012     Chief Complaint  Patient presents with  . Depression  . Dysuria     (Consider location/radiation/quality/duration/timing/severity/associated sxs/prior Treatment) Patient is a 19 y.o. female presenting with dysuria. The history is provided by the patient. No language interpreter was used.  Dysuria Associated symptoms: no abdominal pain, no fever, no nausea and no vomiting   Rayonna Margo Aye is a(n) 19 y.o. female who presents to the ED w cc of depression. History is given by the patient and her mother. The patient states that since she was 12. She has had intermittent bouts with depression. She states she did not want to get started on medication. She states that she does now feel ready. She endorses anhedonia, loss of appetite, difficulties sleeping, irritability. She also has passive suicidal ideation and states that she sometimes she does not wake up. She denies any active plan of suicide, access to weapons or condoms, family history of completed suicide. She denies any alcohol or drug use. She denies homicidal ideation or auditory visual hallucinations. Patient is also complaining of dark urine with foul odor. She has a history of urinary tract infections and wishes to have her urine treated today. She denies any suprapubic or back pain. She denies fevers, chills, nausea, vomiting. The patient has a history of bronchospasm and does not have an inhaler currently. She does not have any symptoms today. She and her mother have just moved to [place]. She is asking for refill of her inhaler.  Past Medical History  Diagnosis Date  . Bronchial spasm    Past Surgical History  Procedure Laterality Date  . Tonsillectomy     History reviewed. No pertinent family history. History  Substance Use Topics  . Smoking status: Current Every Day Smoker --  0.50 packs/day    Types: Cigarettes    Last Attempt to Quit: 10/31/2011  . Smokeless tobacco: Not on file  . Alcohol Use: No   OB History    No data available     Review of Systems  Constitutional: Negative.  Negative for fever and chills.  HENT: Negative.  Negative for trouble swallowing.   Eyes: Negative.   Respiratory: Negative.  Negative for shortness of breath.   Cardiovascular: Negative.  Negative for chest pain.  Gastrointestinal: Negative.  Negative for nausea, vomiting, abdominal pain, diarrhea and constipation.  Genitourinary: Negative for dysuria and hematuria.       Foul odor of urine, dark colored urine  Musculoskeletal: Negative for myalgias and arthralgias.  Skin: Negative for rash.  Neurological: Negative for numbness.  Psychiatric/Behavioral: Positive for dysphoric mood. The patient is nervous/anxious.   All other systems reviewed and are negative.     Allergies  Sulfa drugs cross reactors  Home Medications   Prior to Admission medications   Medication Sig Start Date End Date Taking? Authorizing Provider  Acetaminophen-Caff-Pyrilamine 500-60-15 MG TABS Take 2 tablets by mouth daily as needed (for pain).   Yes Historical Provider, MD  etonogestrel (NEXPLANON) 68 MG IMPL implant 1 each by Subdermal route once. 02/2014   Yes Historical Provider, MD  albuterol (PROVENTIL HFA;VENTOLIN HFA) 108 (90 BASE) MCG/ACT inhaler Inhale 1-2 puffs into the lungs every 6 (six) hours as needed for wheezing or shortness of breath. 12/16/14   Arthor Captain, PA-C  cephALEXin (KEFLEX) 500 MG capsule Take 1 capsule (500 mg  total) by mouth 2 (two) times daily. Patient not taking: Reported on 12/16/2014 09/22/14   Kristen N Ward, DO  naproxen (NAPROSYN) 500 MG tablet Take 1 tablet (500 mg total) by mouth 2 (two) times daily. Patient not taking: Reported on 12/16/2014 09/22/14   Kristen N Ward, DO  ondansetron (ZOFRAN ODT) 4 MG disintegrating tablet Take 1 tablet (4 mg total) by mouth every  8 (eight) hours as needed for nausea or vomiting. Patient not taking: Reported on 12/16/2014 09/22/14   [name], DO  phenazopyridine (PYRIDIUM) 95 MG tablet Take 1 tablet (95 mg total) by mouth 3 (three) times daily as needed for pain. Patient not taking: Reported on 12/16/2014 09/22/14   Kristen N Ward, DO   BP 101/66 mmHg  Pulse 70  Temp(Src) 98.6 F (37 C) (Oral)  Resp 16  SpO2 98%  LMP 12/03/2014 Physical Exam  Constitutional: She is oriented to person, place, and time. She appears well-developed and well-nourished. No distress.  HENT:  Head: Normocephalic and atraumatic.  Eyes: Conjunctivae are normal. No scleral icterus.  Neck: Normal range of motion.  Cardiovascular: Normal rate, regular rhythm and normal heart sounds.  Exam reveals no gallop and no friction rub.   No murmur heard. Pulmonary/Chest: Effort normal and breath sounds normal. No respiratory distress.  Abdominal: Soft. Bowel sounds are normal. She exhibits no distension and no mass. There is no tenderness. There is no guarding.  Neurological: She is alert and oriented to person, place, and time.  Skin: Skin is warm and dry. She is not diaphoretic.  Nursing note and vitals reviewed.   ED Course  Procedures (including critical care time) Labs Review Labs Reviewed  URINALYSIS, ROUTINE W REFLEX MICROSCOPIC - Abnormal; Notable for the following:    Specific Gravity, Urine 1.004 (*)    All other components within normal limits  POC URINE PREG, ED    Imaging Review No results found.   EKG Interpretation None      MDM   Final diagnoses:  Depression  Anhedonia    12:56 PM BP 101/66 mmHg  Pulse 70  Temp(Src) 98.6 F (37 C) (Oral)  Resp 16  SpO2 98%  LMP 12/03/2014 Patient with depression. She has no access to weapons and I feel that her risk of completed suicide is quite low. Her urine is currently pending. Feel the patient can follow up [facility].   Urine negative. D/c  with resources and inhaler. The patient appears reasonably screened and/or stabilized for discharge and I doubt any other medical condition or other [facility] requiring further screening, evaluation, or treatment in the ED at this time prior to discharge.    Arthor Captainbigail Zamarion Longest, PA-C 12/16/14 1256  Gwyneth SproutWhitney Plunkett, MD 12/16/14 1511

## 2014-12-16 NOTE — ED Notes (Addendum)
Per Mom and pt.  She has had years of depression and anxiety.  Pt has avoided meds and not treated before.  Per pt, more depressed in last 2 months.  No trigger.  Loss of appetite. Panic attacks.  Pt not speaking when asked about suicidal thoughts.  Mom verbalizes for patient that she has made statements that "it is not worth living.  Pt also states urine is cloudy and smells.  Burning.  Hx of UTI.  Note:  Mom answers questions for daughter.  Pt not giving much of an answer to question.

## 2015-01-22 ENCOUNTER — Emergency Department (HOSPITAL_BASED_OUTPATIENT_CLINIC_OR_DEPARTMENT_OTHER)
Admission: EM | Admit: 2015-01-22 | Discharge: 2015-01-22 | Discharge status: Against Medical Advice | Payer: Medicaid - Out of State | Attending: Emergency Medicine | Admitting: Emergency Medicine

## 2015-01-22 ENCOUNTER — Encounter (HOSPITAL_BASED_OUTPATIENT_CLINIC_OR_DEPARTMENT_OTHER): Payer: Self-pay | Admitting: *Deleted

## 2015-01-22 DIAGNOSIS — R21 Rash and other nonspecific skin eruption: Secondary | ICD-10-CM | POA: Diagnosis present

## 2015-01-22 DIAGNOSIS — Z720 Tobacco use: Secondary | ICD-10-CM | POA: Diagnosis not present

## 2015-01-22 HISTORY — DX: Anxiety disorder, unspecified: F41.9

## 2015-01-22 HISTORY — DX: Major depressive disorder, single episode, unspecified: F32.9

## 2015-01-22 HISTORY — DX: Depression, unspecified: F32.A

## 2015-01-22 NOTE — ED Notes (Signed)
Rash to hands after taking prozac..  Given ice pack to hold

## 2015-01-22 NOTE — ED Notes (Signed)
Pt sts she recently stopped taking prozac and has a rash on her hands x1 week.

## 2015-04-17 ENCOUNTER — Encounter (HOSPITAL_BASED_OUTPATIENT_CLINIC_OR_DEPARTMENT_OTHER): Payer: Self-pay | Admitting: *Deleted

## 2015-04-17 ENCOUNTER — Emergency Department (HOSPITAL_BASED_OUTPATIENT_CLINIC_OR_DEPARTMENT_OTHER)
Admission: EM | Admit: 2015-04-17 | Discharge: 2015-04-17 | Disposition: A | Discharge status: Home / Self Care | Payer: PRIVATE HEALTH INSURANCE | Attending: Emergency Medicine | Admitting: Emergency Medicine

## 2015-04-17 DIAGNOSIS — Z791 Long term (current) use of non-steroidal anti-inflammatories (NSAID): Secondary | ICD-10-CM | POA: Insufficient documentation

## 2015-04-17 DIAGNOSIS — J069 Acute upper respiratory infection, unspecified: Secondary | ICD-10-CM | POA: Insufficient documentation

## 2015-04-17 DIAGNOSIS — Z720 Tobacco use: Secondary | ICD-10-CM | POA: Insufficient documentation

## 2015-04-17 DIAGNOSIS — Z8659 Personal history of other mental and behavioral disorders: Secondary | ICD-10-CM | POA: Insufficient documentation

## 2015-04-17 DIAGNOSIS — Z79899 Other long term (current) drug therapy: Secondary | ICD-10-CM | POA: Insufficient documentation

## 2015-04-17 DIAGNOSIS — Z792 Long term (current) use of antibiotics: Secondary | ICD-10-CM | POA: Insufficient documentation

## 2015-04-17 LAB — RAPID STREP SCREEN (MED CTR MEBANE ONLY): Streptococcus, Group A Screen (Direct): NEGATIVE

## 2015-04-17 MED ORDER — ACETAMINOPHEN 325 MG PO TABS
650.0000 mg | ORAL_TABLET | Freq: Once | ORAL | Status: AC
  Administered 2015-04-17: 650 mg via ORAL
  Filled 2015-04-17: qty 2

## 2015-04-17 NOTE — ED Notes (Signed)
MD at bedside. 

## 2015-04-17 NOTE — Discharge Instructions (Signed)
Tylenol 1000 mg rotated with Motrin 600 mg every 4 hours as needed for pain or fever.  Drink plenty of fluids and get plenty of rest.  Return to the emergency department if symptoms significantly worsen or change.   Upper Respiratory Infection, Adult An upper respiratory infection (URI) is also sometimes known as the common cold. The upper respiratory tract includes the nose, sinuses, throat, trachea, and bronchi. Bronchi are the airways leading to the lungs. Most people improve within 1 week, but symptoms can last up to 2 weeks. A residual cough may last even longer.  CAUSES Many different viruses can infect the tissues lining the upper respiratory tract. The tissues become irritated and inflamed and often become very moist. Mucus production is also common. A cold is contagious. You can easily spread the virus to others by oral contact. This includes kissing, sharing a glass, coughing, or sneezing. Touching your mouth or nose and then touching a surface, which is then touched by another person, can also spread the virus. SYMPTOMS  Symptoms typically develop 1 to 3 days after you come in contact with a cold virus. Symptoms vary from person to person. They may include:  Runny nose.  Sneezing.  Nasal congestion.  Sinus irritation.  Sore throat.  Loss of voice (laryngitis).  Cough.  Fatigue.  Muscle aches.  Loss of appetite.  Headache.  Low-grade fever. DIAGNOSIS  You might diagnose your own cold based on familiar symptoms, since most people get a cold 2 to 3 times a year. Your caregiver can confirm this based on your exam. Most importantly, your caregiver can check that your symptoms are not due to another disease such as strep throat, sinusitis, pneumonia, asthma, or epiglottitis. Blood tests, throat tests, and X-rays are not necessary to diagnose a common cold, but they may sometimes be helpful in excluding other more serious diseases. Your caregiver will decide if any further  tests are required. RISKS AND COMPLICATIONS  You may be at risk for a more severe case of the common cold if you smoke cigarettes, have chronic heart disease (such as heart failure) or lung disease (such as asthma), or if you have a weakened immune system. The very young and very old are also at risk for more serious infections. Bacterial sinusitis, middle ear infections, and bacterial pneumonia can complicate the common cold. The common cold can worsen asthma and chronic obstructive pulmonary disease (COPD). Sometimes, these complications can require emergency medical care and may be life-threatening. PREVENTION  The best way to protect against getting a cold is to practice good hygiene. Avoid oral or hand contact with people with cold symptoms. Wash your hands often if contact occurs. There is no clear evidence that vitamin C, vitamin E, echinacea, or exercise reduces the chance of developing a cold. However, it is always recommended to get plenty of rest and practice good nutrition. TREATMENT  Treatment is directed at relieving symptoms. There is no cure. Antibiotics are not effective, because the infection is caused by a virus, not by bacteria. Treatment may include:  Increased fluid intake. Sports drinks offer valuable electrolytes, sugars, and fluids.  Breathing heated mist or steam (vaporizer or shower).  Eating chicken soup or other clear broths, and maintaining good nutrition.  Getting plenty of rest.  Using gargles or lozenges for comfort.  Controlling fevers with ibuprofen or acetaminophen as directed by your caregiver.  Increasing usage of your inhaler if you have asthma. Zinc gel and zinc lozenges, taken in the first 24  hours of the common cold, can shorten the duration and lessen the severity of symptoms. Pain medicines may help with fever, muscle aches, and throat pain. A variety of non-prescription medicines are available to treat congestion and runny nose. Your caregiver can  make recommendations and may suggest nasal or lung inhalers for other symptoms.  HOME CARE INSTRUCTIONS   Only take over-the-counter or prescription medicines for pain, discomfort, or fever as directed by your caregiver.  Use a warm mist humidifier or inhale steam from a shower to increase air moisture. This may keep secretions moist and make it easier to breathe.  Drink enough water and fluids to keep your urine clear or pale yellow.  Rest as needed.  Return to work when your temperature has returned to normal or as your caregiver advises. You may need to stay home longer to avoid infecting others. You can also use a face mask and careful hand washing to prevent spread of the virus. SEEK MEDICAL CARE IF:   After the first few days, you feel you are getting worse rather than better.  You need your caregiver's advice about medicines to control symptoms.  You develop chills, worsening shortness of breath, or brown or red sputum. These may be signs of pneumonia.  You develop yellow or brown nasal discharge or pain in the face, especially when you bend forward. These may be signs of sinusitis.  You develop a fever, swollen neck glands, pain with swallowing, or white areas in the back of your throat. These may be signs of strep throat. SEEK IMMEDIATE MEDICAL CARE IF:   You have a fever.  You develop severe or persistent headache, ear pain, sinus pain, or chest pain.  You develop wheezing, a prolonged cough, cough up blood, or have a change in your usual mucus (if you have chronic lung disease).  You develop sore muscles or a stiff neck. Document Released: 04/26/2001 Document Revised: 01/23/2012 Document Reviewed: 02/05/2014 [facility] Patient Information 2015 [place]. This information is not intended to replace advice given to you by your health care provider. Make sure you discuss any questions you have with your health care provider.

## 2015-04-17 NOTE — ED Notes (Signed)
Woke with sore throat, ear pain, headache, body aches, and chills.

## 2015-04-17 NOTE — ED Provider Notes (Signed)
CSN: [id number]     Arrival date & time 04/17/15  2045 History  This chart was scribed for Geoffery Lyons, MD by Bronson Curb, ED Scribe. This patient was seen in room MH05/MH05 and the patient's care was started at 10:15 PM.   Chief Complaint  Patient presents with  . URI    The history is provided by the patient. No language interpreter was used.     HPI Comments: Emily Briggs is a 19 y.o. female who presents to the Emergency Department complaining of a constant, moderate sore throat that began 3 days ago. There is associated fever (Triage temp 101 F), chills, generalized body aches, and diaphoresis. Patient states the pain is her throat is exacerbated with swallowing. Patient has not taken anything for symptom relief PTA, but was given Tylenol here. She denies any sick contacts. She further denies cough or urinary symptoms.   Past Medical History  Diagnosis Date  . Bronchial spasm   . Depression   . Anxiety    Past Surgical History  Procedure Laterality Date  . Tonsillectomy     No family history on file. History  Substance Use Topics  . Smoking status: Current Every Day Smoker -- 0.50 packs/day    Types: Cigarettes    Last Attempt to Quit: 10/31/2011  . Smokeless tobacco: Not on file  . Alcohol Use: No   OB History    No data available     Review of Systems  A complete 10 system review of systems was obtained and all systems are negative except as noted in the HPI and PMH.    Allergies  Sulfa drugs cross reactors  Home Medications   Prior to Admission medications   Medication Sig Start Date End Date Taking? Authorizing Provider  Acetaminophen-Caff-Pyrilamine 500-60-15 MG TABS Take 2 tablets by mouth daily as needed (for pain).    Historical Provider, MD  albuterol (PROVENTIL HFA;VENTOLIN HFA) 108 (90 BASE) MCG/ACT inhaler Inhale 1-2 puffs into the lungs every 6 (six) hours as needed for wheezing or shortness of breath. 12/16/14   Arthor Captain, PA-C   cephALEXin (KEFLEX) 500 MG capsule Take 1 capsule (500 mg total) by mouth 2 (two) times daily. Patient not taking: Reported on 12/16/2014 09/22/14   Layla Maw Ward, DO  etonogestrel (NEXPLANON) 68 MG IMPL implant 1 each by Subdermal route once. 02/2014    Historical Provider, MD  naproxen (NAPROSYN) 500 MG tablet Take 1 tablet (500 mg total) by mouth 2 (two) times daily. Patient not taking: Reported on 12/16/2014 09/22/14   Kristen N Ward, DO  ondansetron (ZOFRAN ODT) 4 MG disintegrating tablet Take 1 tablet (4 mg total) by mouth every 8 (eight) hours as needed for nausea or vomiting. Patient not taking: Reported on 12/16/2014 09/22/14   Layla Maw Ward, DO  phenazopyridine (PYRIDIUM) 95 MG tablet Take 1 tablet (95 mg total) by mouth 3 (three) times daily as needed for pain. Patient not taking: Reported on 12/16/2014 09/22/14   Layla Maw Ward, DO   Triage Vitals: BP 116/67 mmHg  Pulse 129  Temp(Src) 101 F (38.3 C) (Oral)  Resp 20  Ht  (1.651 m)  Wt 137 lb (62.143 kg)  BMI 22.80 kg/m2  SpO2 100%  LMP 04/03/2015  Physical Exam  Constitutional: She is oriented to person, place, and time. She appears well-developed and well-nourished. No distress.  HENT:  Head: Normocephalic and atraumatic.  Eyes: Conjunctivae and EOM are normal.  Neck: Neck supple. No tracheal deviation present.  Cardiovascular: Normal rate.   Pulmonary/Chest: Effort normal. No respiratory distress.  Musculoskeletal: Normal range of motion.  Neurological: She is alert and oriented to person, place, and time.  Skin: Skin is warm and dry.  Psychiatric: She has a normal mood and affect. Her behavior is normal.  Nursing note and vitals reviewed.   ED Course  Procedures (including critical care time)  DIAGNOSTIC STUDIES: Oxygen Saturation is 100% on room air, normal by my interpretation.    COORDINATION OF CARE: At 2218 Discussed treatment plan with patient which includes alternate Tylenol and Motrin and drink plenty of  fluids. Patient agrees.   Labs Review Labs Reviewed  RAPID STREP SCREEN (NOT AT [facility])  CULTURE, GROUP A STREP    Imaging Review No results found.   EKG Interpretation None      MDM   Final diagnoses:  None    Strep test is negative. I suspect a viral etiology. Will recommend continued fluids, Tylenol, Motrin, and when necessary return.  I personally performed the services described in this documentation, which was scribed in my presence. The recorded information has been reviewed and is accurate.      Geoffery Lyonsouglas Coleston Dirosa, MD 04/18/15 2248

## 2015-04-20 ENCOUNTER — Emergency Department (HOSPITAL_BASED_OUTPATIENT_CLINIC_OR_DEPARTMENT_OTHER)
Admission: EM | Admit: 2015-04-20 | Discharge: 2015-04-21 | Disposition: A | Discharge status: Home / Self Care | Payer: PRIVATE HEALTH INSURANCE | Attending: Emergency Medicine | Admitting: Emergency Medicine

## 2015-04-20 ENCOUNTER — Emergency Department (HOSPITAL_BASED_OUTPATIENT_CLINIC_OR_DEPARTMENT_OTHER): Payer: PRIVATE HEALTH INSURANCE

## 2015-04-20 ENCOUNTER — Encounter (HOSPITAL_BASED_OUTPATIENT_CLINIC_OR_DEPARTMENT_OTHER): Payer: Self-pay | Admitting: Emergency Medicine

## 2015-04-20 DIAGNOSIS — Z791 Long term (current) use of non-steroidal anti-inflammatories (NSAID): Secondary | ICD-10-CM | POA: Insufficient documentation

## 2015-04-20 DIAGNOSIS — Z8659 Personal history of other mental and behavioral disorders: Secondary | ICD-10-CM | POA: Insufficient documentation

## 2015-04-20 DIAGNOSIS — Z720 Tobacco use: Secondary | ICD-10-CM | POA: Insufficient documentation

## 2015-04-20 DIAGNOSIS — J069 Acute upper respiratory infection, unspecified: Secondary | ICD-10-CM | POA: Insufficient documentation

## 2015-04-20 DIAGNOSIS — Z792 Long term (current) use of antibiotics: Secondary | ICD-10-CM | POA: Insufficient documentation

## 2015-04-20 MED ORDER — BENZONATATE 100 MG PO CAPS: 100.0000 mg | ORAL_CAPSULE | Freq: Three times a day (TID) | ORAL | Status: DC

## 2015-04-20 MED ORDER — MAGIC MOUTHWASH: 5.0000 mL | Freq: Three times a day (TID) | ORAL | Status: DC | PRN

## 2015-04-20 MED ORDER — FLUTICASONE PROPIONATE 50 MCG/ACT NA SUSP: 2.0000 | Freq: Every day | NASAL | Status: DC

## 2015-04-20 NOTE — ED Notes (Signed)
19 yo with recently dx of URI symptoms painful to swallow and spitting bloody mucous. 7/10 pain. Has been taking the ibuprofen and tylenol w/o relief.

## 2015-04-20 NOTE — Discharge Instructions (Signed)
Cool Mist Vaporizers °Vaporizers may help relieve the symptoms of a cough and cold. They add moisture to the air, which helps mucus to become thinner and less sticky. This makes it easier to breathe and cough up secretions. Cool mist vaporizers do not cause serious burns like hot mist vaporizers, which may also be called steamers or humidifiers. Vaporizers have not been proven to help with colds. You should not use a vaporizer if you are allergic to mold. °HOME CARE INSTRUCTIONS °· Follow the package instructions for the vaporizer. °· Do not use anything other than distilled water in the vaporizer. °· Do not run the vaporizer all of the time. This can cause mold or bacteria to grow in the vaporizer. °· Clean the vaporizer after each time it is used. °· Clean and dry the vaporizer well before storing it. °· Stop using the vaporizer if worsening respiratory symptoms develop. °Document Released: 07/28/2004 Document Revised: 11/05/2013 Document Reviewed: 03/20/2013 °Exit[employer]® Patient Information ©2015 [employer]. This information is not intended to replace advice given to you by your health care provider. Make sure you discuss any questions you have with your health care provider. ° °

## 2015-04-20 NOTE — ED Provider Notes (Signed)
CSN: [id number]     Arrival date & time 04/20/15  2200 History  This chart was scribed for Galina Haddox, MD by Octavia Heir, ED Scribe. This patient was seen in room MHT13/MHT13 and the patient's care was started at 11:13 PM.    Chief Complaint  Patient presents with  . URI      Patient is a 19 y.o. female presenting with URI. The history is provided by the patient. No language interpreter was used.  URI Presenting symptoms: congestion, cough and sore throat   Presenting symptoms: no ear pain and no facial pain   Congestion:    Location:  Nasal   Interferes with sleep: no     Interferes with eating/drinking: no   Severity:  Moderate Onset quality:  Gradual Timing:  Constant Progression:  Unchanged Chronicity:  New Relieved by:  Nothing Worsened by:  Nothing tried Ineffective treatments:  OTC medications Associated symptoms: no arthralgias, no myalgias, no neck pain and no wheezing   Risk factors: not elderly    HPI Comments: Emily Briggs is a 19 y.o. female who presents to the Emergency Department complaining of constant, moderate sore throat that began 5 days ago. Patient states the pain is her throat is exacerbated with swallowing. Has bloody drainage down throat and cough.  Taking tylenol and ibuprofen at home without relief.  She denies any sick contacts. She denies  urinary symptoms.  Past Medical History  Diagnosis Date  . Bronchial spasm   . Depression   . Anxiety    Past Surgical History  Procedure Laterality Date  . Tonsillectomy     No family history on file. History  Substance Use Topics  . Smoking status: Current Every Day Smoker -- 0.50 packs/day    Types: Cigarettes    Last Attempt to Quit: 10/31/2011  . Smokeless tobacco: Not on file  . Alcohol Use: No   OB History    No data available     Review of Systems  Constitutional: Negative for diaphoresis.  HENT: Positive for congestion, postnasal drip and sore throat. Negative for ear pain, trouble  swallowing and voice change.   Respiratory: Positive for cough. Negative for wheezing.   Cardiovascular: Negative for chest pain.  Musculoskeletal: Negative for myalgias, arthralgias, neck pain and neck stiffness.  Skin: Negative for rash.  All other systems reviewed and are negative.     Allergies  Sulfa drugs cross reactors  Home Medications   Prior to Admission medications   Medication Sig Start Date End Date Taking? Authorizing Provider  Acetaminophen-Caff-Pyrilamine 500-60-15 MG TABS Take 2 tablets by mouth daily as needed (for pain).    Historical Provider, MD  albuterol (PROVENTIL HFA;VENTOLIN HFA) 108 (90 BASE) MCG/ACT inhaler Inhale 1-2 puffs into the lungs every 6 (six) hours as needed for wheezing or shortness of breath. 12/16/14   Arthor Captain, PA-C  cephALEXin (KEFLEX) 500 MG capsule Take 1 capsule (500 mg total) by mouth 2 (two) times daily. Patient not taking: Reported on 12/16/2014 09/22/14   Layla Maw Ward, DO  etonogestrel (NEXPLANON) 68 MG IMPL implant 1 each by Subdermal route once. 02/2014    Historical Provider, MD  naproxen (NAPROSYN) 500 MG tablet Take 1 tablet (500 mg total) by mouth 2 (two) times daily. Patient not taking: Reported on 12/16/2014 09/22/14   Kristen N Ward, DO  ondansetron (ZOFRAN ODT) 4 MG disintegrating tablet Take 1 tablet (4 mg total) by mouth every 8 (eight) hours as needed for nausea or vomiting. Patient  not taking: Reported on 12/16/2014 09/22/14   [name], DO  phenazopyridine (PYRIDIUM) 95 MG tablet Take 1 tablet (95 mg total) by mouth 3 (three) times daily as needed for pain. Patient not taking: Reported on 12/16/2014 09/22/14   [name], DO   Triage vitals: BP 135/77 mmHg  Pulse 108  Temp(Src) 99.4 F (37.4 C) (Oral)  Resp 18  Ht 5\' 5"  (1.651 m)  Wt 137 lb (62.143 kg)  BMI 22.80 kg/m2  SpO2 100%  LMP 04/03/2015 Physical Exam  Constitutional: She is oriented to person, place, and time. She appears well-developed and  well-nourished. No distress.  HENT:  Head: Normocephalic and atraumatic.  Mouth/Throat: Oropharynx is clear and moist.  Scant bloody post nasal drip with cobblestoning.  No facial tenderness  Eyes: Conjunctivae and EOM are normal. Pupils are equal, round, and reactive to light.  Neck: Normal range of motion. Neck supple. No tracheal deviation present.  Cardiovascular: Normal rate, regular rhythm and intact distal pulses.   Pulmonary/Chest: Effort normal and breath sounds normal. No stridor. No respiratory distress. She has no wheezes. She has no rales.  Abdominal: Soft. Bowel sounds are normal. There is no tenderness. There is no rebound and no guarding.  Musculoskeletal: Normal range of motion.  Lymphadenopathy:    She has no cervical adenopathy.  Neurological: She is alert and oriented to person, place, and time.  Skin: Skin is warm and dry.  Psychiatric: She has a normal mood and affect.  Nursing note and vitals reviewed.   ED Course  Procedures  DIAGNOSTIC STUDIES: Oxygen Saturation is 100% on RA, normal by my interpretation.  COORDINATION OF CARE:  11:13 PM Discussed treatment plan which includes medication to clear congestion, cough suppressant with pt at bedside and pt agreed to plan.  Labs Review Labs Reviewed - No data to display  Imaging Review Dg Chest 2 View  04/20/2015   CLINICAL DATA:  Upper respiratory infection. Sore throat, chills, body aches for 1 week.  EXAM: CHEST  2 VIEW  COMPARISON:  05/20/2014  FINDINGS: The cardiomediastinal contours are normal. The lungs are clear. Pulmonary vasculature is normal. No consolidation, pleural effusion, or pneumothorax. No acute osseous abnormalities are seen.  IMPRESSION: No acute pulmonary process.   Electronically Signed   By: Rubye OaksMelanie  Ehinger M.D.   On: 04/20/2015 22:42     EKG Interpretation None      MDM   Final diagnoses:  None    Normal chest xray and strep.  Symptoms are clearly viral and have advised flonase  and increased liquids in addition to tylenol and ibuprofen.    I personally performed the services described in this documentation, which was scribed in my presence. The recorded information has been reviewed and is accurate.    Cy BlamerApril Nathanal Hermiz, MD 04/21/15 [phone]

## 2015-04-21 ENCOUNTER — Encounter (HOSPITAL_BASED_OUTPATIENT_CLINIC_OR_DEPARTMENT_OTHER): Payer: Self-pay | Admitting: Emergency Medicine

## 2015-04-21 LAB — CULTURE, GROUP A STREP

## 2015-09-01 ENCOUNTER — Emergency Department (HOSPITAL_COMMUNITY)
Admission: EM | Admit: 2015-09-01 | Discharge: 2015-09-01 | Disposition: A | Discharge status: Home / Self Care | Payer: PRIVATE HEALTH INSURANCE | Attending: Emergency Medicine | Admitting: Emergency Medicine

## 2015-09-01 ENCOUNTER — Encounter (HOSPITAL_COMMUNITY): Payer: Self-pay | Admitting: Emergency Medicine

## 2015-09-01 DIAGNOSIS — Z720 Tobacco use: Secondary | ICD-10-CM | POA: Insufficient documentation

## 2015-09-01 DIAGNOSIS — Z7951 Long term (current) use of inhaled steroids: Secondary | ICD-10-CM | POA: Insufficient documentation

## 2015-09-01 DIAGNOSIS — Z8659 Personal history of other mental and behavioral disorders: Secondary | ICD-10-CM | POA: Insufficient documentation

## 2015-09-01 DIAGNOSIS — Z79899 Other long term (current) drug therapy: Secondary | ICD-10-CM | POA: Insufficient documentation

## 2015-09-01 DIAGNOSIS — J069 Acute upper respiratory infection, unspecified: Secondary | ICD-10-CM | POA: Insufficient documentation

## 2015-09-01 MED ORDER — AZITHROMYCIN 250 MG PO TABS: 250.0000 mg | ORAL_TABLET | Freq: Every day | ORAL | Status: DC

## 2015-09-01 NOTE — Discharge Instructions (Signed)
Upper Respiratory Infection, Adult Most upper respiratory infections (URIs) are a viral infection of the air passages leading to the lungs. A URI affects the nose, throat, and upper air passages. The most common type of URI is nasopharyngitis and is typically referred to as "the common cold." URIs run their course and usually go away on their own. Most of the time, a URI does not require medical attention, but sometimes a bacterial infection in the upper airways can follow a viral infection. This is called a secondary infection. Sinus and middle ear infections are common types of secondary upper respiratory infections. Bacterial pneumonia can also complicate a URI. A URI can worsen asthma and chronic obstructive pulmonary disease (COPD). Sometimes, these complications can require emergency medical care and may be life threatening.  CAUSES Almost all URIs are caused by viruses. A virus is a type of germ and can spread from one person to another.  RISKS FACTORS You may be at risk for a URI if:   You smoke.   You have chronic heart or lung disease.  You have a weakened defense (immune) system.   You are very young or very old.   You have nasal allergies or asthma.  You work in crowded or poorly ventilated areas.  You work in health care facilities or schools. SIGNS AND SYMPTOMS  Symptoms typically develop 2-3 days after you come in contact with a cold virus. Most viral URIs last 7-10 days. However, viral URIs from the influenza virus (flu virus) can last 14-18 days and are typically more severe. Symptoms may include:   Runny or stuffy (congested) nose.   Sneezing.   Cough.   Sore throat.   Headache.   Fatigue.   Fever.   Loss of appetite.   Pain in your forehead, behind your eyes, and over your cheekbones (sinus pain).  Muscle aches.  DIAGNOSIS  Your health care provider may diagnose a URI by:  Physical exam.  Tests to check that your symptoms are not due to  another condition such as:  Strep throat.  Sinusitis.  Pneumonia.  Asthma. TREATMENT  A URI goes away on its own with time. It cannot be cured with medicines, but medicines may be prescribed or recommended to relieve symptoms. Medicines may help:  Reduce your fever.  Reduce your cough.  Relieve nasal congestion. HOME CARE INSTRUCTIONS   Take medicines only as directed by your health care provider.   Gargle warm saltwater or take cough drops to comfort your throat as directed by your health care provider.  Use a warm mist humidifier or inhale steam from a shower to increase air moisture. This may make it easier to breathe.  Drink enough fluid to keep your urine clear or pale yellow.   Eat soups and other clear broths and maintain good nutrition.   Rest as needed.   Return to work when your temperature has returned to normal or as your health care provider advises. You may need to stay home longer to avoid infecting others. You can also use a face mask and careful hand washing to prevent spread of the virus.  Increase the usage of your inhaler if you have asthma.   Do not use any tobacco products, including cigarettes, chewing tobacco, or electronic cigarettes. If you need help quitting, ask your health care provider. PREVENTION  The best way to protect yourself from getting a cold is to practice good hygiene.   Avoid oral or hand contact with people with cold   symptoms.   Wash your hands often if contact occurs.  There is no clear evidence that vitamin C, vitamin E, echinacea, or exercise reduces the chance of developing a cold. However, it is always recommended to get plenty of rest, exercise, and practice good nutrition.  SEEK MEDICAL CARE IF:   You are getting worse rather than better.   Your symptoms are not controlled by medicine.   You have chills.  You have worsening shortness of breath.  You have brown or red mucus.  You have yellow or brown nasal  discharge.  You have pain in your face, especially when you bend forward.  You have a fever.  You have swollen neck glands.  You have pain while swallowing.  You have white areas in the back of your throat. SEEK IMMEDIATE MEDICAL CARE IF:   You have severe or persistent:  Headache.  Ear pain.  Sinus pain.  Chest pain.  You have chronic lung disease and any of the following:  Wheezing.  Prolonged cough.  Coughing up blood.  A change in your usual mucus.  You have a stiff neck.  You have changes in your:  Vision.  Hearing.  Thinking.  Mood. MAKE SURE YOU:   Understand these instructions.  Will watch your condition.  Will get help right away if you are not doing well or get worse.   This information is not intended to replace advice given to you by your health care provider. Make sure you discuss any questions you have with your health care provider.   Document Released: 04/26/2001 Document Revised: 03/17/2015 Document Reviewed: 02/05/2014 Elsevier Interactive Patient Education 2016 [employer]  

## 2015-09-01 NOTE — ED Provider Notes (Signed)
CSN: [id number]     Arrival date & time 09/01/15  1035 History  By signing my name below, I, Jarvis Morgan, attest that this documentation has been prepared under the direction and in the presence of Roxy Horseman, PA-C Electronically Signed: Jarvis Morgan, ED Scribe. 09/01/2015. 12:15 PM.     Chief Complaint  Patient presents with  . URI   The history is provided by the patient. No language interpreter was used.    Emily Briggs is a 19 y.o. female who presents to the Emergency Department with a chief complaint of a moderate, gradually worsening, intermittent, dry cough onset 2 weeks. She reports associated rhinorrhea, sinus pressure, congestion, subjective fever and chills. Pt states she initially thought her symptoms were related to seasonal allergies. She has been taking Dayquil, Ibuprofen and using cough drops with no relief. Pt admits she is current everyday smoker with 0.5 ppd history. She denies any sore throat, otalgia, or wheezing.   Past Medical History  Diagnosis Date  . Bronchial spasm   . Depression   . Anxiety    Past Surgical History  Procedure Laterality Date  . Tonsillectomy     No family history on file. Social History  Substance Use Topics  . Smoking status: Current Every Day Smoker -- 0.50 packs/day    Types: Cigarettes    Last Attempt to Quit: 10/31/2011  . Smokeless tobacco: None  . Alcohol Use: No   OB History    No data available     Review of Systems  Constitutional: Positive for fever (subjective) and chills (subjective).  HENT: Positive for congestion, rhinorrhea and sinus pressure. Negative for ear pain and sore throat.   Respiratory: Positive for cough. Negative for wheezing.       Allergies  Sulfa drugs cross reactors  Home Medications   Prior to Admission medications   Medication Sig Start Date End Date Taking? Authorizing Provider  Acetaminophen-Caff-Pyrilamine 500-60-15 MG TABS Take 2 tablets by mouth daily as needed (for  pain).    Historical Provider, MD  albuterol (PROVENTIL HFA;VENTOLIN HFA) 108 (90 BASE) MCG/ACT inhaler Inhale 1-2 puffs into the lungs every 6 (six) hours as needed for wheezing or shortness of breath. 12/16/14   Arthor Captain, PA-C  Alum & Mag Hydroxide-Simeth (MAGIC MOUTHWASH) SOLN Take 5 mLs by mouth 3 (three) times daily as needed for mouth pain. 04/20/15   April Palumbo, MD  benzonatate (TESSALON) 100 MG capsule Take 1 capsule (100 mg total) by mouth every 8 (eight) hours. 04/20/15   April Palumbo, MD  cephALEXin (KEFLEX) 500 MG capsule Take 1 capsule (500 mg total) by mouth 2 (two) times daily. Patient not taking: Reported on 12/16/2014 09/22/14   Layla Maw Ward, DO  etonogestrel (NEXPLANON) 68 MG IMPL implant 1 each by Subdermal route once. 02/2014    Historical Provider, MD  fluticasone (FLONASE) 50 MCG/ACT nasal spray Place 2 sprays into both nostrils daily. 04/20/15   April Palumbo, MD  naproxen (NAPROSYN) 500 MG tablet Take 1 tablet (500 mg total) by mouth 2 (two) times daily. Patient not taking: Reported on 12/16/2014 09/22/14   Kristen N Ward, DO  ondansetron (ZOFRAN ODT) 4 MG disintegrating tablet Take 1 tablet (4 mg total) by mouth every 8 (eight) hours as needed for nausea or vomiting. Patient not taking: Reported on 12/16/2014 09/22/14   Layla Maw Ward, DO  phenazopyridine (PYRIDIUM) 95 MG tablet Take 1 tablet (95 mg total) by mouth 3 (three) times daily as needed for pain. Patient  not taking: Reported on 12/16/2014 09/22/14   [name], DO   Triage Vitals: BP 118/72 mmHg  Pulse 73  Temp(Src) 98.1 F (36.7 C) (Oral)  Resp 18  SpO2 100%  LMP 08/02/2015  Physical Exam  Constitutional: She is oriented to person, place, and time. She appears well-developed and well-nourished. No distress.  HENT:  Head: Normocephalic and atraumatic.  Right Ear: Tympanic membrane normal.  Left Ear: Tympanic membrane normal.  Nose: Right sinus exhibits maxillary sinus tenderness (mild). Left sinus exhibits  maxillary sinus tenderness (mild).  Mouth/Throat: Uvula is midline. Posterior oropharyngeal erythema (mildly) present.  Eyes: Conjunctivae and EOM are normal.  Neck: Neck supple. No tracheal deviation present.  Cardiovascular: Normal rate, regular rhythm and normal heart sounds.   Pulmonary/Chest: Effort normal and breath sounds normal. No respiratory distress.  Musculoskeletal: Normal range of motion.  Neurological: She is alert and oriented to person, place, and time.  Skin: Skin is warm and dry.  Psychiatric: She has a normal mood and affect. Her behavior is normal.  Nursing note and vitals reviewed.   ED Course  Procedures (including critical care time)  DIAGNOSTIC STUDIES: Oxygen Saturation is 100% on RA, normal by my interpretation.    COORDINATION OF CARE:  12:30 PM- Will give pt rx for z-pak.  Pt advised of plan for treatment and pt agrees.      MDM   Final diagnoses:  URI (upper respiratory infection)    Patient with URI x 2 weeks.  Patient may benefit from a short abx, but discussed that this may be viral.   I, Roosevelt Eimers, personally performed the services described in this documentation. All medical record entries made by the scribe were at my direction and in my presence.  I have reviewed the chart and discharge instructions and agree that the record reflects my personal performance and is accurate and complete. Ivoree Felmlee.  09/01/2015. 12:35 PM.      Roxy Horsemanobert Klarisa Barman, PA-C 09/01/15 1235  Laurence Spatesachel Morgan Little, MD 09/02/15 2255

## 2015-09-01 NOTE — ED Notes (Signed)
Pt reports 2 week hx of cough, sinus pressure, "fullness in head". Unable to cough up secretions

## 2015-09-01 NOTE — ED Notes (Signed)
Per pt, states nasal congestion and cold symptoms for a couple of days

## 2015-09-17 ENCOUNTER — Emergency Department (HOSPITAL_COMMUNITY): Payer: Self-pay

## 2015-09-17 ENCOUNTER — Emergency Department (HOSPITAL_COMMUNITY): Payer: PRIVATE HEALTH INSURANCE

## 2015-09-17 ENCOUNTER — Emergency Department (HOSPITAL_COMMUNITY)
Admission: EM | Admit: 2015-09-17 | Discharge: 2015-09-17 | Disposition: A | Discharge status: Home / Self Care | Payer: Self-pay | Attending: Emergency Medicine | Admitting: Emergency Medicine

## 2015-09-17 ENCOUNTER — Encounter (HOSPITAL_COMMUNITY): Payer: Self-pay | Admitting: Emergency Medicine

## 2015-09-17 DIAGNOSIS — H547 Unspecified visual loss: Secondary | ICD-10-CM | POA: Insufficient documentation

## 2015-09-17 DIAGNOSIS — Z720 Tobacco use: Secondary | ICD-10-CM | POA: Insufficient documentation

## 2015-09-17 DIAGNOSIS — F419 Anxiety disorder, unspecified: Secondary | ICD-10-CM | POA: Insufficient documentation

## 2015-09-17 DIAGNOSIS — R519 Headache, unspecified: Secondary | ICD-10-CM

## 2015-09-17 DIAGNOSIS — R531 Weakness: Secondary | ICD-10-CM | POA: Insufficient documentation

## 2015-09-17 DIAGNOSIS — Z3202 Encounter for pregnancy test, result negative: Secondary | ICD-10-CM | POA: Insufficient documentation

## 2015-09-17 DIAGNOSIS — R51 Headache: Secondary | ICD-10-CM | POA: Insufficient documentation

## 2015-09-17 DIAGNOSIS — Z79899 Other long term (current) drug therapy: Secondary | ICD-10-CM | POA: Insufficient documentation

## 2015-09-17 DIAGNOSIS — F329 Major depressive disorder, single episode, unspecified: Secondary | ICD-10-CM | POA: Insufficient documentation

## 2015-09-17 LAB — URINALYSIS, ROUTINE W REFLEX MICROSCOPIC
Bilirubin Urine: NEGATIVE
Glucose, UA: NEGATIVE mg/dL
Hgb urine dipstick: NEGATIVE
Ketones, ur: NEGATIVE mg/dL
Leukocytes, UA: NEGATIVE
Nitrite: NEGATIVE
Protein, ur: NEGATIVE mg/dL
Specific Gravity, Urine: 1.009 (ref 1.005–1.030)
Urobilinogen, UA: 0.2 mg/dL (ref 0.0–1.0)
pH: 7 (ref 5.0–8.0)

## 2015-09-17 LAB — COMPREHENSIVE METABOLIC PANEL
ALT: 15 U/L (ref 14–54)
AST: 22 U/L (ref 15–41)
Albumin: 4.3 g/dL (ref 3.5–5.0)
Alkaline Phosphatase: 54 U/L (ref 38–126)
Anion gap: 6 (ref 5–15)
BUN: 8 mg/dL (ref 6–20)
CO2: 27 mmol/L (ref 22–32)
Calcium: 9.9 mg/dL (ref 8.9–10.3)
Chloride: 106 mmol/L (ref 101–111)
Creatinine, Ser: 0.89 mg/dL (ref 0.44–1.00)
GFR calc Af Amer: >60 mL/min (ref >60)
GFR calc non Af Amer: >60 mL/min (ref >60)
Glucose, Bld: 103 mg/dL — ABNORMAL HIGH (ref 65–99)
Potassium: 4 mmol/L (ref 3.5–5.1)
Sodium: 139 mmol/L (ref 135–145)
Total Bilirubin: 0.8 mg/dL (ref 0.3–1.2)
Total Protein: 7.2 g/dL (ref 6.5–8.1)

## 2015-09-17 LAB — CBC WITH DIFFERENTIAL/PLATELET
Basophils Absolute: 0 10*3/uL (ref 0.0–0.1)
Basophils Relative: 0 %
Eosinophils Absolute: 0.1 10*3/uL (ref 0.0–0.7)
Eosinophils Relative: 1 %
HCT: 41.5 % (ref 36.0–46.0)
Hemoglobin: 13.7 g/dL (ref 12.0–15.0)
Lymphocytes Relative: 25 %
Lymphs Abs: 2.1 10*3/uL (ref 0.7–4.0)
MCH: 26.6 pg (ref 26.0–34.0)
MCHC: 33 g/dL (ref 30.0–36.0)
MCV: 80.6 fL (ref 78.0–100.0)
Monocytes Absolute: 0.7 10*3/uL (ref 0.1–1.0)
Monocytes Relative: 8 %
Neutro Abs: 5.5 10*3/uL (ref 1.7–7.7)
Neutrophils Relative %: 66 %
Platelets: 273 10*3/uL (ref 150–400)
RBC: 5.15 MIL/uL — ABNORMAL HIGH (ref 3.87–5.11)
RDW: 13.5 % (ref 11.5–15.5)
WBC: 8.4 10*3/uL (ref 4.0–10.5)

## 2015-09-17 LAB — I-STAT TROPONIN, ED: Troponin i, poc: 0 ng/mL (ref 0.00–0.08)

## 2015-09-17 LAB — RAPID URINE DRUG SCREEN, HOSP PERFORMED
Amphetamines: NOT DETECTED
Barbiturates: NOT DETECTED
Benzodiazepines: NOT DETECTED
Cocaine: NOT DETECTED
Opiates: NOT DETECTED
Tetrahydrocannabinol: POSITIVE — AB

## 2015-09-17 LAB — ETHANOL: Alcohol, Ethyl (B): <5 mg/dL (ref <5)

## 2015-09-17 LAB — POC URINE PREG, ED: Preg Test, Ur: NEGATIVE

## 2015-09-17 MED ORDER — DIPHENHYDRAMINE HCL 50 MG/ML IJ SOLN
25.0000 mg | Freq: Once | INTRAMUSCULAR | Status: AC
  Administered 2015-09-17: 25 mg via INTRAVENOUS
  Filled 2015-09-17: qty 1

## 2015-09-17 MED ORDER — METOCLOPRAMIDE HCL 5 MG/ML IJ SOLN
10.0000 mg | Freq: Once | INTRAMUSCULAR | Status: AC
  Administered 2015-09-17: 10 mg via INTRAVENOUS
  Filled 2015-09-17: qty 2

## 2015-09-17 NOTE — ED Notes (Signed)
Pt tolerating crackers and gingerale

## 2015-09-17 NOTE — ED Notes (Signed)
Spoke with radiology about CT scan being read, will follow up shortly to make sure its in process

## 2015-09-17 NOTE — ED Provider Notes (Signed)
CSN: [id number]     Arrival date & time 09/17/15  1023 History   First MD Initiated Contact with Patient 09/17/15 1124     Chief Complaint  Patient presents with  . Loss of Vision  . Headache  . Weakness     (Consider location/radiation/quality/duration/timing/severity/associated sxs/prior Treatment) Patient is a 19 y.o. female presenting with neurologic complaint.  Neurologic Problem This is a new problem. Episode onset: on awakening. The problem occurs constantly. The problem has not changed since onset.Pertinent negatives include no chest pain, no abdominal pain, no headaches and no shortness of breath. Nothing aggravates the symptoms. Nothing relieves the symptoms. She has tried nothing for the symptoms.    Past Medical History  Diagnosis Date  . Bronchial spasm   . Depression   . Anxiety    Past Surgical History  Procedure Laterality Date  . Tonsillectomy     History reviewed. No pertinent family history. Social History  Substance Use Topics  . Smoking status: Current Every Day Smoker -- 0.50 packs/day    Types: Cigarettes    Last Attempt to Quit: 10/31/2011  . Smokeless tobacco: None  . Alcohol Use: No   OB History    No data available     Review of Systems  Respiratory: Negative for shortness of breath.   Cardiovascular: Negative for chest pain.  Gastrointestinal: Negative for abdominal pain.  Neurological: Negative for headaches.  All other systems reviewed and are negative.     Allergies  Sulfa drugs cross reactors and Citalopram  Home Medications   Prior to Admission medications   Medication Sig Start Date End Date Taking? Authorizing Provider  Acetaminophen-Caff-Pyrilamine 500-60-15 MG TABS Take 2 tablets by mouth daily as needed (for pain).    Historical Provider, MD  albuterol (PROVENTIL HFA;VENTOLIN HFA) 108 (90 BASE) MCG/ACT inhaler Inhale 1-2 puffs into the lungs every 6 (six) hours as needed for wheezing or shortness of breath. 12/16/14    Arthor Captain, PA-C  Alum & Mag Hydroxide-Simeth (MAGIC MOUTHWASH) SOLN Take 5 mLs by mouth 3 (three) times daily as needed for mouth pain. Patient not taking: Reported on 09/17/2015 04/20/15   April Palumbo, MD  benzonatate (TESSALON) 100 MG capsule Take 1 capsule (100 mg total) by mouth every 8 (eight) hours. Patient not taking: Reported on 09/17/2015 04/20/15   April Palumbo, MD  etonogestrel (NEXPLANON) 68 MG IMPL implant 1 each by Subdermal route once. 02/2014    Historical Provider, MD  fluticasone (FLONASE) 50 MCG/ACT nasal spray Place 2 sprays into both nostrils daily. Patient not taking: Reported on 09/17/2015 04/20/15   April Palumbo, MD  naproxen (NAPROSYN) 500 MG tablet Take 1 tablet (500 mg total) by mouth 2 (two) times daily. Patient not taking: Reported on 12/16/2014 09/22/14   Kristen N Ward, DO  ondansetron (ZOFRAN ODT) 4 MG disintegrating tablet Take 1 tablet (4 mg total) by mouth every 8 (eight) hours as needed for nausea or vomiting. Patient not taking: Reported on 12/16/2014 09/22/14   Layla Maw Ward, DO  phenazopyridine (PYRIDIUM) 95 MG tablet Take 1 tablet (95 mg total) by mouth 3 (three) times daily as needed for pain. Patient not taking: Reported on 12/16/2014 09/22/14   Kristen N Ward, DO   BP 105/67 mmHg  Pulse 67  Temp(Src) 97.9 F (36.6 C) (Oral)  Resp 17  SpO2 98%  LMP 09/13/2015 Physical Exam  Constitutional: She is oriented to person, place, and time. She appears well-developed and well-nourished.  HENT:  Head: Normocephalic and  atraumatic.  Right Ear: External ear normal.  Left Ear: External ear normal.  Eyes: Conjunctivae and EOM are normal. Pupils are equal, round, and reactive to light.  Neck: Normal range of motion. Neck supple.  Cardiovascular: Normal rate, regular rhythm, normal heart sounds and intact distal pulses.   Pulmonary/Chest: Effort normal and breath sounds normal.  Abdominal: Soft. Bowel sounds are normal. There is no tenderness.  Musculoskeletal:  Normal range of motion.  Neurological: She is alert and oriented to person, place, and time. She has normal reflexes. No cranial nerve deficit or sensory deficit. She exhibits abnormal muscle tone (poor effort in bil le). Coordination normal. GCS eye subscore is 4. GCS verbal subscore is 5. GCS motor subscore is 6.  Skin: Skin is warm and dry.  Vitals reviewed.   ED Course  Procedures (including critical care time) Labs Review Labs Reviewed  COMPREHENSIVE METABOLIC PANEL - Abnormal; Notable for the following:    Glucose, Bld 103 (*)    All other components within normal limits  CBC WITH DIFFERENTIAL/PLATELET - Abnormal; Notable for the following:    RBC 5.15 (*)    All other components within normal limits  URINE RAPID DRUG SCREEN, HOSP PERFORMED - Abnormal; Notable for the following:    Tetrahydrocannabinol POSITIVE (*)    All other components within normal limits  ETHANOL  URINALYSIS, ROUTINE W REFLEX MICROSCOPIC (NOT AT [facility])  Rosezena SensorI-STAT TROPOININ, ED  POC URINE PREG, ED    Imaging Review Dg Chest 2 View  09/17/2015  CLINICAL DATA:  Headache EXAM: CHEST  2 VIEW COMPARISON:  04/20/2015 FINDINGS: Cardiomediastinal silhouette is stable. No acute infiltrate or pleural effusion. No pulmonary edema. Mild thoracic dextroscoliosis. IMPRESSION: No active cardiopulmonary disease. Electronically Signed   By: Natasha MeadLiviu  Pop M.D.   On: 09/17/2015 12:31   Ct Head Wo Contrast  09/17/2015  CLINICAL DATA:  Weakness.  Blurry vision. EXAM: CT HEAD WITHOUT CONTRAST TECHNIQUE: Contiguous axial images were obtained from the base of the skull through the vertex without intravenous contrast. COMPARISON:  01/18/2015 FINDINGS: No acute cortical infarct, hemorrhage, or mass lesion ispresent. Ventricles are of normal size. No significant extra-axial fluid collection is present. The paranasal sinuses andmastoid air cells are clear. The osseous skull is intact. IMPRESSION: Normal exam. Electronically Signed   By: Signa Kellaylor   Stroud M.D.   On: 09/17/2015 15:03   I have personally reviewed and evaluated these images and lab results as part of my medical decision-making.   EKG Interpretation None      MDM   Final diagnoses:  Acute nonintractable headache, unspecified headache type    19 y.o. female with pertinent PMH of depression, anxiety presents with generalized weakness, blurred vision, dizziness on awakening this am. Patient states she is a daily marijuana smoker, smoked new marijuana last night and this morning awoke with symptoms. On arrival vital signs and physical exam as above. No focal neurodeficits. Patient is very anxious. Suspect weaknesses effort dependent. Workup as above unremarkable.  DC home in stable condition  I have reviewed all laboratory and imaging studies if ordered as above  1. Acute nonintractable headache, unspecified headache type         Mirian MoMatthew Millisa Giarrusso, MD 09/17/15 1513

## 2015-09-17 NOTE — Discharge Instructions (Signed)
Cannabis Use Disorder Cannabis use disorder is a mental disorder. It is not one-time or occasional use of cannabis, more commonly known as marijuana. Cannabis use disorder is the continued, nonmedical use of cannabis that interferes with normal life activities or causes health problems. People with cannabis use disorder get a feeling of extreme pleasure and relaxation from cannabis use. This "high" is very rewarding and causes people to use over and over.  The mind-altering ingredient in cannabis is know as THC. THC can also interfere with motor coordination, memory, judgment, and accurate sense of space and time. These effects can last for a few days after using cannabis. Regular heavy cannabis use can cause long-lasting problems with thinking and learning. In young people, these problems may be permanent. Cannabis sometimes causes severe anxiety, paranoia, or visual hallucinations. Man-made (synthetic) cannabis-like drugs, such as "spice" and "K2," cause the same effects as THC but are much stronger. Cannabis-like drugs can cause dangerously high blood pressure and heart rate.  Cannabis use disorder usually starts in the teenage years. It can trigger the development of schizophrenia. It is somewhat more common in men than women. People who have family members with the disorder or existing mental health issues such as depression and posttraumatic stress disorderare more likely to develop cannabis use disorder. People with cannabis use disorder are at higher risk for use of other drugs of abuse.  SIGNS AND SYMPTOMS Signs and symptoms of cannabis use disorder include:   Use of cannabis in larger amounts or over a longer period than intended.   Unsuccessful attempts to cut down or control cannabis use.   A lot of time spent obtaining, using, or recovering from the effects of cannabis.   A strong desire or urge to use cannabis (cravings).   Continued use of cannabis in spite of problems at work,  school, or home because of use.   Continued use of cannabis in spite of relationship problems because of use.  Giving up or cutting down on important life activities because of cannabis use.  Use of cannabis over and over even in situations when it is physically hazardous, such as when driving a car.   Continued use of cannabis in spite of a physical problem that is likely related to use. Physical problems can include:  Chronic cough.  Bronchitis.  Emphysema.  Throat and lung cancer.  Continued use of cannabis in spite of a mental problem that is likely related to use. Mental problems can include:  Psychosis.  Anxiety.  Difficulty sleeping.  Need to use more and more cannabis to get the same effect, or lessened effect over time with use of the same amount (tolerance).  Having withdrawal symptoms when cannabis use is stopped, or using cannabis to reduce or avoid withdrawal symptoms. Withdrawal symptoms include:  Irritability or anger.  Anxiety or restlessness.  Difficulty sleeping.  Loss of appetite or weight.  Aches and pains.  Shakiness.  Sweating.  Chills. DIAGNOSIS Cannabis use disorder is diagnosed by your health care provider. You may be asked questions about your cannabis use and how it affects your life. A physical exam may be done. A drug screen may be done. You may be referred to a mental health professional. The diagnosis of cannabis use disorder requires at least two symptoms within 12 months. The type of cannabis use disorder you have depends on the number of symptoms you have. The type may be:  Mild. Two or three signs and symptoms.   Moderate. Four or   five signs and symptoms.   Severe. Six or more signs and symptoms.  TREATMENT Treatment is usually provided by mental health professionals with training in substance use disorders. The following options are available:  Counseling or talk therapy. Talk therapy addresses the reasons you use  cannabis. It also addresses ways to keep you from using again. The goals of talk therapy include:  Identifying and avoiding triggers for use.  Learning how to handle cravings.  Replacing use with healthy activities.  Support groups. Support groups provide emotional support, advice, and guidance.  Medicine. Medicine is used to treat mental health issues that trigger cannabis use or that result from it. HOME CARE INSTRUCTIONS  Take medicines only as directed by your health care provider.  Check with your health care provider before starting any new medicines.  Keep all follow-up visits as directed by your health care provider. SEEK MEDICAL CARE IF:  You are not able to take your medicines as directed.  Your symptoms get worse. SEEK IMMEDIATE MEDICAL CARE IF: You have serious thoughts about hurting yourself or others. FOR MORE INFORMATION  National Institute on Drug Abuse: www.drugabuse.gov  Substance Abuse and Mental Health Services Administration: www.samhsa.gov   This information is not intended to replace advice given to you by your health care provider. Make sure you discuss any questions you have with your health care provider.   Document Released: 10/28/2000 Document Revised: 11/21/2014 Document Reviewed: 11/13/2013 Elsevier Interactive Patient Education 2016 Elsevier Inc.  

## 2015-09-17 NOTE — ED Notes (Addendum)
Patient states she went to bed last night and woke up feeling weak, vision issues such as blurry vision and feels like crossed, legs were weak. Smoked weed last night according to family that did not look normal so potentially laced with something. No hx of stroke. Bilateral sensation intact. States having difficulty bearing weight. 0200 09/17/15 was LSN.  A&Ox3 no droop noted.

## 2015-09-17 NOTE — ED Notes (Signed)
Discussed symptoms with MD

## 2016-01-15 ENCOUNTER — Emergency Department (HOSPITAL_BASED_OUTPATIENT_CLINIC_OR_DEPARTMENT_OTHER)
Admission: EM | Admit: 2016-01-15 | Discharge: 2016-01-15 | Disposition: A | Discharge status: Home / Self Care | Payer: PRIVATE HEALTH INSURANCE | Attending: Emergency Medicine | Admitting: Emergency Medicine

## 2016-01-15 ENCOUNTER — Encounter (HOSPITAL_BASED_OUTPATIENT_CLINIC_OR_DEPARTMENT_OTHER): Payer: Self-pay | Admitting: Emergency Medicine

## 2016-01-15 DIAGNOSIS — R112 Nausea with vomiting, unspecified: Secondary | ICD-10-CM | POA: Insufficient documentation

## 2016-01-15 DIAGNOSIS — Z8659 Personal history of other mental and behavioral disorders: Secondary | ICD-10-CM | POA: Insufficient documentation

## 2016-01-15 DIAGNOSIS — Z3202 Encounter for pregnancy test, result negative: Secondary | ICD-10-CM | POA: Insufficient documentation

## 2016-01-15 DIAGNOSIS — Z8709 Personal history of other diseases of the respiratory system: Secondary | ICD-10-CM | POA: Insufficient documentation

## 2016-01-15 DIAGNOSIS — R197 Diarrhea, unspecified: Secondary | ICD-10-CM | POA: Insufficient documentation

## 2016-01-15 DIAGNOSIS — Z8679 Personal history of other diseases of the circulatory system: Secondary | ICD-10-CM | POA: Insufficient documentation

## 2016-01-15 DIAGNOSIS — R61 Generalized hyperhidrosis: Secondary | ICD-10-CM | POA: Insufficient documentation

## 2016-01-15 DIAGNOSIS — R51 Headache: Secondary | ICD-10-CM | POA: Insufficient documentation

## 2016-01-15 DIAGNOSIS — H538 Other visual disturbances: Secondary | ICD-10-CM | POA: Insufficient documentation

## 2016-01-15 DIAGNOSIS — Z79899 Other long term (current) drug therapy: Secondary | ICD-10-CM | POA: Insufficient documentation

## 2016-01-15 DIAGNOSIS — F17210 Nicotine dependence, cigarettes, uncomplicated: Secondary | ICD-10-CM | POA: Insufficient documentation

## 2016-01-15 LAB — CBC
HCT: 42.4 % (ref 36.0–46.0)
Hemoglobin: 13.9 g/dL (ref 12.0–15.0)
MCH: 26.5 pg (ref 26.0–34.0)
MCHC: 32.8 g/dL (ref 30.0–36.0)
MCV: 80.9 fL (ref 78.0–100.0)
Platelets: 231 10*3/uL (ref 150–400)
RBC: 5.24 MIL/uL — ABNORMAL HIGH (ref 3.87–5.11)
RDW: 13.8 % (ref 11.5–15.5)
WBC: 7 10*3/uL (ref 4.0–10.5)

## 2016-01-15 LAB — COMPREHENSIVE METABOLIC PANEL
ALT: 24 U/L (ref 14–54)
AST: 29 U/L (ref 15–41)
Albumin: 4.6 g/dL (ref 3.5–5.0)
Alkaline Phosphatase: 59 U/L (ref 38–126)
Anion gap: 7 (ref 5–15)
BUN: 12 mg/dL (ref 6–20)
CO2: 25 mmol/L (ref 22–32)
Calcium: 9.5 mg/dL (ref 8.9–10.3)
Chloride: 104 mmol/L (ref 101–111)
Creatinine, Ser: 0.73 mg/dL (ref 0.44–1.00)
GFR calc Af Amer: >60 mL/min (ref >60)
GFR calc non Af Amer: >60 mL/min (ref >60)
Glucose, Bld: 81 mg/dL (ref 65–99)
Potassium: 3.7 mmol/L (ref 3.5–5.1)
Sodium: 136 mmol/L (ref 135–145)
Total Bilirubin: 0.3 mg/dL (ref 0.3–1.2)
Total Protein: 7.5 g/dL (ref 6.5–8.1)

## 2016-01-15 LAB — URINALYSIS, ROUTINE W REFLEX MICROSCOPIC
Bilirubin Urine: NEGATIVE
Glucose, UA: NEGATIVE mg/dL
Hgb urine dipstick: NEGATIVE
Ketones, ur: NEGATIVE mg/dL
Leukocytes, UA: NEGATIVE
Nitrite: NEGATIVE
Protein, ur: NEGATIVE mg/dL
Specific Gravity, Urine: 1.01 (ref 1.005–1.030)
pH: 6 (ref 5.0–8.0)

## 2016-01-15 LAB — LIPASE, BLOOD: Lipase: 24 U/L (ref 11–51)

## 2016-01-15 LAB — PREGNANCY, URINE: Preg Test, Ur: NEGATIVE

## 2016-01-15 MED ORDER — ONDANSETRON 4 MG PO TBDP: 4.0000 mg | ORAL_TABLET | Freq: Three times a day (TID) | ORAL | Status: DC | PRN

## 2016-01-15 MED ORDER — ONDANSETRON HCL 4 MG/2ML IJ SOLN
4.0000 mg | Freq: Once | INTRAMUSCULAR | Status: AC
  Administered 2016-01-15: 4 mg via INTRAVENOUS
  Filled 2016-01-15: qty 2

## 2016-01-15 MED ORDER — KETOROLAC TROMETHAMINE 15 MG/ML IJ SOLN
15.0000 mg | Freq: Once | INTRAMUSCULAR | Status: AC
Start: 2016-01-15 — End: 2016-01-15
  Administered 2016-01-15: 15 mg via INTRAVENOUS
  Filled 2016-01-15: qty 1

## 2016-01-15 NOTE — ED Notes (Signed)
MD at bedside. 

## 2016-01-15 NOTE — ED Notes (Signed)
20 yo with c/o emesis and diarrhea x1 week states she wakes up drenched in sweat. Has not taken any OTC. A/O Denies fever.

## 2016-01-15 NOTE — ED Provider Notes (Signed)
CSN: [id number]     Arrival date & time 01/15/16  1830 History  By signing my name below, I, Phillis HaggisGabriella Gaje, attest that this documentation has been prepared under the direction and in the presence of Alvira MondayErin Cherine Drumgoole, MD. Electronically Signed: Phillis HaggisGabriella Gaje, ED Scribe. 01/15/2016. 8:05 PM.   Chief Complaint  Patient presents with  . Emesis  . Diarrhea   Patient is a 20 y.o. female presenting with vomiting and diarrhea. The history is provided by the patient. No language interpreter was used.  Emesis Severity:  Moderate Duration:  1 week Timing:  Constant Quality:  Undigested food Able to tolerate:  Liquids Progression:  Worsening Chronicity:  New Recent urination:  Normal Ineffective treatments:  None tried Associated symptoms: diarrhea and headaches   Associated symptoms: no abdominal pain, no fever and no sore throat   Risk factors: no sick contacts, no suspect food intake and no travel to endemic areas   Diarrhea Associated symptoms: diaphoresis, headaches and vomiting   Associated symptoms: no abdominal pain and no fever   HPI Comments: Hebert SohoBrooklynn Hall is a 20 y.o. female with a hx of migraines who presents to the Emergency Department complaining of nausea, vomiting, and constant diarrhea onset one week ago. She reports associated diaphoresis at night, and gradual headache with blurred vision that began today. Visual changes are consistent with prior migraine auras. Pt reports that she will wake up drenched in sweat. She states that the headache feels similar to her past migraines. She reports 3 episodes of emesis today. She was able to tolerate soup today but not her coffee this morning. She has not taken anything for her symptoms. She denies travel outside of the country, recent use of antibiotics, sick contacts, dysuria, hematuria, vaginal bleeding, vaginal discharge, hematemesis or hematochezia.   Past Medical History  Diagnosis Date  . Bronchial spasm   . Depression   .  Anxiety    Past Surgical History  Procedure Laterality Date  . Tonsillectomy     No family history on file. Social History  Substance Use Topics  . Smoking status: Current Every Day Smoker -- 0.50 packs/day    Types: Cigarettes    Last Attempt to Quit: 10/31/2011  . Smokeless tobacco: None  . Alcohol Use: No   OB History    No data available     Review of Systems  Constitutional: Positive for diaphoresis. Negative for fever.  HENT: Negative for sore throat.   Eyes: Negative for visual disturbance (typical migraine aura improved now).  Respiratory: Negative for cough and shortness of breath.   Cardiovascular: Negative for chest pain.  Gastrointestinal: Positive for vomiting and diarrhea. Negative for abdominal pain.  Genitourinary: Negative for dysuria, hematuria, vaginal bleeding, vaginal discharge and difficulty urinating.  Musculoskeletal: Negative for back pain and neck pain.  Skin: Negative for rash.  Neurological: Positive for headaches. Negative for syncope, weakness and numbness.   Allergies  Sulfa drugs cross reactors and Citalopram  Home Medications   Prior to Admission medications   Medication Sig Start Date End Date Taking? Authorizing Provider  Acetaminophen-Caff-Pyrilamine 500-60-15 MG TABS Take 2 tablets by mouth daily as needed (for pain).    Historical Provider, MD  albuterol (PROVENTIL HFA;VENTOLIN HFA) 108 (90 BASE) MCG/ACT inhaler Inhale 1-2 puffs into the lungs every 6 (six) hours as needed for wheezing or shortness of breath. 12/16/14   Arthor CaptainAbigail Harris, PA-C  Alum & Mag Hydroxide-Simeth (MAGIC MOUTHWASH) SOLN Take 5 mLs by mouth 3 (three) times daily as  needed for mouth pain. Patient not taking: Reported on 09/17/2015 04/20/15   April Palumbo, MD  benzonatate (TESSALON) 100 MG capsule Take 1 capsule (100 mg total) by mouth every 8 (eight) hours. Patient not taking: Reported on 09/17/2015 04/20/15   April Palumbo, MD  etonogestrel (NEXPLANON) 68 MG IMPL implant  1 each by Subdermal route once. 02/2014    Historical Provider, MD  fluticasone (FLONASE) 50 MCG/ACT nasal spray Place 2 sprays into both nostrils daily. Patient not taking: Reported on 09/17/2015 04/20/15   April Palumbo, MD  naproxen (NAPROSYN) 500 MG tablet Take 1 tablet (500 mg total) by mouth 2 (two) times daily. Patient not taking: Reported on 12/16/2014 09/22/14   Kristen N Ward, DO  ondansetron (ZOFRAN ODT) 4 MG disintegrating tablet Take 1 tablet (4 mg total) by mouth every 8 (eight) hours as needed for nausea or vomiting. 01/15/16   Alvira Monday, MD  phenazopyridine (PYRIDIUM) 95 MG tablet Take 1 tablet (95 mg total) by mouth 3 (three) times daily as needed for pain. Patient not taking: Reported on 12/16/2014 09/22/14   Kristen N Ward, DO   BP 112/69 mmHg  Pulse 71  Temp(Src) 98 F (36.7 C) (Oral)  Resp 16  Ht  (1.651 m)  Wt 135 lb (61.236 kg)  BMI 22.47 kg/m2  SpO2 100% Physical Exam  Constitutional: She is oriented to person, place, and time. She appears well-developed and well-nourished.  HENT:  Head: Normocephalic and atraumatic.  Eyes: EOM are normal. Pupils are equal, round, and reactive to light.  Neck: Normal range of motion. Neck supple.  Cardiovascular: Normal rate, regular rhythm and normal heart sounds.  Exam reveals no gallop and no friction rub.   No murmur heard. Pulmonary/Chest: Effort normal and breath sounds normal. She has no wheezes.  Abdominal: Soft. There is no tenderness.  Musculoskeletal: Normal range of motion.  Neurological: She is alert and oriented to person, place, and time.  Skin: Skin is warm and dry.  Psychiatric: She has a normal mood and affect. Her behavior is normal.  Nursing note and vitals reviewed.   ED Course  Procedures (including critical care time) DIAGNOSTIC STUDIES: Oxygen Saturation is 100% on RA, normal by my interpretation.    COORDINATION OF CARE: 6:54 PM-Discussed treatment plan which includes IV fluids and migraine  cocktail with pt at bedside and pt agreed to plan.    Labs Review Labs Reviewed  CBC - Abnormal; Notable for the following:    RBC 5.24 (*)    All other components within normal limits  URINALYSIS, ROUTINE W REFLEX MICROSCOPIC (NOT AT [facility]) - Abnormal; Notable for the following:    APPearance CLOUDY (*)    All other components within normal limits  LIPASE, BLOOD  COMPREHENSIVE METABOLIC PANEL  PREGNANCY, URINE    Imaging Review No results found. I have personally reviewed and evaluated these lab results as part of my medical decision-making.   EKG Interpretation None      MDM  IV fluids and labs due to the length of symptoms Will give pt migraine cocktail for relief Most likely viral etiology   Final diagnoses:  Nausea vomiting and diarrhea   19yo female with history of depression/anxiety presents with concern for diarrhea, nausea and vomiting.  Abdominal exam benign, doubt appendicitis, cholecystitis, diverticulitis. No recent abx to suggest CDiff. Labs do not show significant abnormalities.  Pt with headache, feels like prior migraines, improved spontaneously in ED, no sudden onset, doubt SAH/meningitis. Given toradol/zofran.  Given  rx for zofran and discussed supportive care for likely viral gastroenteritis and need for PCP follow up if symptoms continue.  In setting of acute diarrheal illness, feel diaphoresis likely secondary to viral illness, however discussed need for close follow up for this with PCP if it does not resolve.  Patient discharged in stable condition with understanding of reasons to return.   I personally performed the services described in this documentation, which was scribed in my presence. The recorded information has been reviewed and is accurate.    Alvira Monday, MD 01/16/16 1224

## 2016-02-12 ENCOUNTER — Encounter (HOSPITAL_COMMUNITY): Payer: Self-pay

## 2016-02-12 ENCOUNTER — Emergency Department (HOSPITAL_COMMUNITY)
Admission: EM | Admit: 2016-02-12 | Discharge: 2016-02-12 | Disposition: A | Discharge status: Home / Self Care | Payer: PRIVATE HEALTH INSURANCE | Attending: Emergency Medicine | Admitting: Emergency Medicine

## 2016-02-12 DIAGNOSIS — Z8709 Personal history of other diseases of the respiratory system: Secondary | ICD-10-CM | POA: Insufficient documentation

## 2016-02-12 DIAGNOSIS — H00015 Hordeolum externum left lower eyelid: Secondary | ICD-10-CM | POA: Insufficient documentation

## 2016-02-12 DIAGNOSIS — L723 Sebaceous cyst: Secondary | ICD-10-CM | POA: Insufficient documentation

## 2016-02-12 DIAGNOSIS — H0016 Chalazion left eye, unspecified eyelid: Secondary | ICD-10-CM

## 2016-02-12 DIAGNOSIS — Z8659 Personal history of other mental and behavioral disorders: Secondary | ICD-10-CM | POA: Insufficient documentation

## 2016-02-12 DIAGNOSIS — F17210 Nicotine dependence, cigarettes, uncomplicated: Secondary | ICD-10-CM | POA: Insufficient documentation

## 2016-02-12 MED ORDER — LIDOCAINE HCL (PF) 1 % IJ SOLN
5.0000 mL | Freq: Once | INTRAMUSCULAR | Status: AC
  Administered 2016-02-12: 5 mL via INTRADERMAL
  Filled 2016-02-12: qty 5

## 2016-02-12 MED ORDER — TETRACAINE HCL 0.5 % OP SOLN
2.0000 [drp] | Freq: Once | OPHTHALMIC | Status: AC
  Administered 2016-02-12: 2 [drp] via OPHTHALMIC
  Filled 2016-02-12: qty 4

## 2016-02-12 MED ORDER — GENTAMICIN SULFATE 0.3 % OP SOLN: 2.0000 [drp] | OPHTHALMIC | Status: DC

## 2016-02-12 MED ORDER — FLUORESCEIN SODIUM 1 MG OP STRP
1.0000 | ORAL_STRIP | Freq: Once | OPHTHALMIC | Status: AC
  Administered 2016-02-12: 1 via OPHTHALMIC
  Filled 2016-02-12: qty 1

## 2016-02-12 NOTE — Discharge Instructions (Signed)
Sebaceous Cyst Removal Sebaceous cyst removal is a procedure to remove a sac of oily material that forms under your skin (sebaceous cyst). Sebaceous cysts may also be called epidermoid cysts or keratin cysts. Normally, the skin secretes this oily material through a gland or a hair follicle. This type of cyst usually results when a skin gland or hair follicle becomes blocked. You may need this procedure if you have a sebaceous cyst that becomes large, uncomfortable, or infected. LET [facility] CARE PROVIDER KNOW ABOUT:  Any allergies you have.  All medicines you are taking, including vitamins, herbs, eye drops, creams, and over-the-counter medicines.  Previous problems you or members of your family have had with the use of anesthetics.  Any blood disorders you have.  Previous surgeries you have had.  Medical conditions you have. RISKS AND COMPLICATIONS Generally, this is a safe procedure. However, problems may occur, including:  Developing another cyst.  Bleeding.  Infection.  Scarring. BEFORE THE PROCEDURE  Ask your health care provider about:  Changing or stopping your regular medicines. This is especially important if you are taking diabetes medicines or blood thinners.  Taking medicines such as aspirin and ibuprofen. These medicines can thin your blood. Do not take these medicines before your procedure if your health care provider instructs you not to.  If you have an infected cyst, you may have to take antibiotic medicines before or after the cyst removal. Take your antibiotics as directed by your health care provider. Finish all of the medicine even if you start to feel better.  Take a shower on the morning of your procedure. Your health care provider may ask you to use a germ-killing (antiseptic) soap. PROCEDURE  You will be given a medicine that numbs the area (local anesthetic).  The skin around the cyst will be cleaned with a germ-killing solution  (antiseptic).  Your health care provider will make a small surgical incision over the cyst.  The cyst will be separated from the surrounding tissues that are under your skin.  If possible, the cyst will be removed undamaged (intact).  If the cyst bursts (ruptures), it will need to be removed in pieces.  After the cyst is removed, your health care provider will control any bleeding and close the incision with small stitches (sutures). Small incisions may not need sutures, and the bleeding will be controlled by applying direct pressure with gauze.  Your health care provider may apply antibiotic ointment and a light bandage (dressing) over the incision. This procedure may vary among health care providers and hospitals. AFTER THE PROCEDURE  If your cyst ruptured during surgery, you may need to take antibiotic medicine. If you were prescribed an antibiotic medicine, finish all of it even if you start to feel better.   This information is not intended to replace advice given to you by your health care provider. Make sure you discuss any questions you have with your health care provider.   Document Released: 10/28/2000 Document Revised: 11/21/2014 Document Reviewed: 07/16/2014 Elsevier Interactive Patient Education 2016 Elsevier Inc.  Chalazion A chalazion is a swelling or lump on the eyelid. It can affect the upper or lower eyelid. CAUSES This condition may be caused by:  Long-lasting (chronic) inflammation of the eyelid glands.  A blocked oil gland in the eyelid. SYMPTOMS Symptoms of this condition include:  A swelling on the eyelid. The swelling may spread to areas around the eye.  A hard lump on the eyelid. This lump may make it hard  to see out of the eye. DIAGNOSIS This condition is diagnosed with an examination of the eye. TREATMENT This condition is treated by applying a warm compress to the eyelid. If the condition does not improve after two days, it may be treated  with:  Surgery.  Medicine that is injected into the chalazion by a health care provider.  Medicine that is applied to the eye. HOME CARE INSTRUCTIONS  Do not touch the chalazion.  Do not try to remove the pus, such as by squeezing the chalazion or sticking it with a pin or needle.  Do not rub your eyes.  Wash your hands often. Dry your hands with a clean towel.  Keep your face, scalp, and eyebrows clean.  Avoid wearing eye makeup.  Apply a warm, moist compress to the eyelid 4-6 times a day for 10-15 minutes at a time. This will help to open any blocked glands and help to reduce redness and swelling.  Apply over-the-counter and prescription medicines only as told by your health care provider.  If the chalazion does not break open (rupture) on its own in a month, return to your health care provider.  Keep all follow-up appointments as told by your health care provider. This is important. SEEK MEDICAL CARE IF:  Your eyelid has not improved in 4 weeks.  Your eyelid is getting worse.  You have a fever.  The chalazion does not rupture on its own with home treatment in a month. SEEK IMMEDIATE MEDICAL CARE IF:  You have pain in your eye.  Your vision changes.  The chalazion becomes painful or red  The chalazion gets bigger.   This information is not intended to replace advice given to you by your health care provider. Make sure you discuss any questions you have with your health care provider.   Document Released: 10/28/2000 Document Revised: 07/22/2015 Document Reviewed: 02/23/2015 Elsevier Interactive Patient Education [employer] Elsevier Inc.

## 2016-02-12 NOTE — ED Notes (Signed)
Pt with painful left eye for x 1 week.  Pt with swelling under left eye.  Using warm compresses.  Pt also has raised area left of eye that popped up about a month ago

## 2016-02-12 NOTE — ED Provider Notes (Signed)
CSN: [id number]     Arrival date & time 02/12/16  1324 History   First MD Initiated Contact with Patient 02/12/16 1534     Chief Complaint  Patient presents with  . Eye Pain     (Consider location/radiation/quality/duration/timing/severity/associated sxs/prior Treatment) HPI   Emily Briggs is a(n) 20 y.o. female who presents to the Ed with cc of eye pain. She has a bump in the L lower eyelid that has been progressively gettig larger and is now mildly painful. She also has a raised area about 1 cm from the Left lateral canthus that has gotten very large and painful. She denies chages in vision, eye injury, photophobia. She wears glasses.  Past Medical History  Diagnosis Date  . Bronchial spasm   . Depression   . Anxiety    Past Surgical History  Procedure Laterality Date  . Tonsillectomy     History reviewed. No pertinent family history. Social History  Substance Use Topics  . Smoking status: Current Every Day Smoker -- 0.50 packs/day    Types: Cigarettes    Last Attempt to Quit: 10/31/2011  . Smokeless tobacco: None  . Alcohol Use: No   OB History    No data available     Review of Systems   Ten systems reviewed and are negative for acute change, except as noted in the HPI.   Allergies  Sulfa drugs cross reactors and Citalopram  Home Medications   Prior to Admission medications   Medication Sig Start Date End Date Taking? Authorizing Provider  etonogestrel (NEXPLANON) 68 MG IMPL implant 1 each by Subdermal route once. 02/2014   Yes Historical Provider, MD  ondansetron (ZOFRAN ODT) 4 MG disintegrating tablet Take 1 tablet (4 mg total) by mouth every 8 (eight) hours as needed for nausea or vomiting. 01/15/16  Yes Alvira Monday, MD   BP 120/73 mmHg  Pulse 69  Temp(Src) 98.1 F (36.7 C) (Oral)  Resp 14  SpO2 100%  LMP 01/11/2016 Physical Exam  Constitutional: She is oriented to person, place, and time. She appears well-developed and well-nourished. No  distress.  HENT:  Head: Normocephalic and atraumatic.  Eyes: Conjunctivae are normal. Right eye exhibits no chemosis, no discharge, no exudate and no hordeolum. No foreign body present in the right eye. Left eye exhibits no chemosis, no discharge and no exudate. No foreign body present in the left eye. No scleral icterus.  Slit lamp exam:      The right eye shows no fluorescein uptake.       The left eye shows no fluorescein uptake.    Pressure OD-18 OS-16  Neck: Normal range of motion.  Cardiovascular: Normal rate, regular rhythm and normal heart sounds.  Exam reveals no gallop and no friction rub.   No murmur heard. Pulmonary/Chest: Effort normal and breath sounds normal. No respiratory distress.  Abdominal: Soft. Bowel sounds are normal. She exhibits no distension and no mass. There is no tenderness. There is no guarding.  Neurological: She is alert and oriented to person, place, and time.  Skin: Skin is warm and dry. She is not diaphoretic.    ED Course  .Marland KitchenIncision and Drainage Date/Time: 02/12/2016 4:45 PM Performed by: Arthor Captain Authorized by: Arthor Captain Consent: Verbal consent obtained. Risks and benefits: risks, benefits and alternatives were discussed Consent given by: patient Required items: required blood products, implants, devices, and special equipment available Patient identity confirmed: verbally with patient and provided demographic data Type: cyst Body area: head Location details: face  Anesthesia: local infiltration Local anesthetic: lidocaine 1% without epinephrine Anesthetic total: 1 ml Patient sedated: no Scalpel size: 15 Incision type: single straight Incision depth: dermal Complexity: simple Drainage characteristics: sebaceous. Drainage amount: scant Wound treatment: 1 suture placed with 6.0 prolene. Patient tolerance: Patient tolerated the procedure well with no immediate complications   (including critical care time) Labs Review Labs  Reviewed - No data to display  Imaging Review No results found. I have personally reviewed and evaluated these images and lab results as part of my medical decision-making.   EKG Interpretation None      MDM   Final diagnoses:  Meibomian gland cyst, left  Inflamed sebaceous cyst    The patient instructed to return to the emergency department or to any urgent care for suture removal in 5 days. She'll be discharged with gentamicin ophthalmic drops. She may continue to apply warm compresses to the meibomian gland that is blocked. She is advised to follow-up with Dr. Sherryll BurgerShah should she have any new or concerning symptoms. She appears safe for discharge at this time. Home care instructions given, and reasons to seek immediate medical attention discussed.    Arthor Captainbigail Horace Lukas, PA-C 02/12/16 1657  Alvira MondayErin Schlossman, MD 02/15/16 1435

## 2016-02-18 ENCOUNTER — Emergency Department (HOSPITAL_COMMUNITY)
Admission: EM | Admit: 2016-02-18 | Discharge: 2016-02-18 | Disposition: A | Discharge status: Home / Self Care | Payer: PRIVATE HEALTH INSURANCE | Attending: Emergency Medicine | Admitting: Emergency Medicine

## 2016-02-18 ENCOUNTER — Encounter (HOSPITAL_COMMUNITY): Payer: Self-pay | Admitting: *Deleted

## 2016-02-18 DIAGNOSIS — F17210 Nicotine dependence, cigarettes, uncomplicated: Secondary | ICD-10-CM | POA: Insufficient documentation

## 2016-02-18 DIAGNOSIS — Z8709 Personal history of other diseases of the respiratory system: Secondary | ICD-10-CM | POA: Insufficient documentation

## 2016-02-18 DIAGNOSIS — Z8659 Personal history of other mental and behavioral disorders: Secondary | ICD-10-CM | POA: Insufficient documentation

## 2016-02-18 DIAGNOSIS — H0015 Chalazion left lower eyelid: Secondary | ICD-10-CM | POA: Insufficient documentation

## 2016-02-18 DIAGNOSIS — Z4802 Encounter for removal of sutures: Secondary | ICD-10-CM | POA: Insufficient documentation

## 2016-02-18 DIAGNOSIS — Z792 Long term (current) use of antibiotics: Secondary | ICD-10-CM | POA: Insufficient documentation

## 2016-02-18 NOTE — ED Notes (Signed)
Pt was seen here on March 31st and got stitches around her left eye.  Pt here to get stitches removed.

## 2016-02-18 NOTE — ED Provider Notes (Signed)
CSN: [id number]     Arrival date & time 02/18/16  1411 History  By signing my name below, I, [name], attest that this documentation has been prepared under the direction and in the presence of non-physician practitioner, Jaynie Crumble, PA-C. Electronically Signed: [name], Scribe. 02/18/2016. 3:13 PM.    Chief Complaint  Patient presents with  . Suture / Staple Removal   The history is provided by the patient. No language interpreter was used.     HPI Comments:  Emily Briggs is a 20 y.o. female who presents to the Emergency Department for a suture removal. Pt had a sebaceous cyst removed from left eye in the ED on 02/12/16 and 1 suture was placed afterward. She denies complications following discharge; notes mild continued pressure at the site. Pt was instructed to  follow up with Dr. Sherryll Burger, opthalmologist, but she did not as her symptoms have improved. At this time pt has no new complaints or symptoms.  Past Medical History  Diagnosis Date  . Bronchial spasm   . Depression   . Anxiety    Past Surgical History  Procedure Laterality Date  . Tonsillectomy     No family history on file. Social History  Substance Use Topics  . Smoking status: Current Every Day Smoker -- 0.50 packs/day    Types: Cigarettes    Last Attempt to Quit: 10/31/2011  . Smokeless tobacco: None  . Alcohol Use: No   OB History    No data available     Review of Systems  Constitutional: Negative for fever and chills.  Skin: Positive for wound.    Allergies  Sulfa drugs cross reactors and Citalopram  Home Medications   Prior to Admission medications   Medication Sig Start Date End Date Taking? Authorizing Provider  etonogestrel (NEXPLANON) 68 MG IMPL implant 1 each by Subdermal route once. 02/2014    Historical Provider, MD  gentamicin (GARAMYCIN) 0.3 % ophthalmic solution Place 2 drops into the left eye every 4 (four) hours. 02/12/16   Abigail Harris, PA-C  ondansetron (ZOFRAN ODT) 4  MG disintegrating tablet Take 1 tablet (4 mg total) by mouth every 8 (eight) hours as needed for nausea or vomiting. 01/15/16   Alvira Monday, MD   BP 109/72 mmHg  Pulse 95  Temp(Src) 98.6 F (37 C) (Oral)  Resp 16  SpO2 97%  LMP 02/14/2016 Physical Exam  Constitutional: She is oriented to person, place, and time. She appears well-developed and well-nourished. No distress.  HENT:  Head: Normocephalic and atraumatic.  Eyes: Conjunctivae and EOM are normal. Pupils are equal, round, and reactive to light.  Small chalazion to the left lower eyelid, nontender.  Cardiovascular: Normal rate.   Pulmonary/Chest: Effort normal.  Abdominal: She exhibits no distension.  Neurological: She is alert and oriented to person, place, and time.  Skin: Skin is warm and dry.  One suture intact, over drained cyst, just lateral to the lateral canthus of the left eye. Appears to be healing well with no signs of infection, drainage, erythema, tenderness.   Psychiatric: She has a normal mood and affect.  Nursing note and vitals reviewed.   ED Course  Procedures   DIAGNOSTIC STUDIES:  Oxygen Saturation is 97% on RA, normal by my interpretation.    COORDINATION OF CARE:  3:12 PM Discussed treatment plan with pt at bedside and pt agreed to plan.  1 Suture Removal  MDM   Pt to ED for staple/suture removal and wound check as above. Procedure  tolerated well. Vitals normal, no signs of infection. Scar minimization & return precautions given at dc.   Final diagnoses:  Visit for suture removal  Chalazion of left lower eyelid   Patient with a small nontender chalazion to the left lower eyelid. She was given referral to ophthalmologist, she will need to follow-up for further treatment. At this time there is no signs of infection.  Suture removed from the laceration to the left eye.   Filed Vitals:   02/18/16 1415  BP: 109/72  Pulse: 95  Temp: 98.6 F (37 C)  TempSrc: Oral  Resp: 16  SpO2: 97%      Jaynie Crumbleatyana Terri Rorrer, PA-C 02/18/16 2030  Alvira MondayErin Schlossman, MD 02/22/16 [phone]

## 2016-02-18 NOTE — Discharge Instructions (Signed)
Continue warm compresses. Apply bacitracin to the wound twice a day. Follow up with ophthalmology as needed.    Chalazion A chalazion is a swelling or lump on the eyelid. It can affect the upper or lower eyelid. CAUSES This condition may be caused by:  Long-lasting (chronic) inflammation of the eyelid glands.  A blocked oil gland in the eyelid. SYMPTOMS Symptoms of this condition include:  A swelling on the eyelid. The swelling may spread to areas around the eye.  A hard lump on the eyelid. This lump may make it hard to see out of the eye. DIAGNOSIS This condition is diagnosed with an examination of the eye. TREATMENT This condition is treated by applying a warm compress to the eyelid. If the condition does not improve after two days, it may be treated with:  Surgery.  Medicine that is injected into the chalazion by a health care provider.  Medicine that is applied to the eye. HOME CARE INSTRUCTIONS  Do not touch the chalazion.  Do not try to remove the pus, such as by squeezing the chalazion or sticking it with a pin or needle.  Do not rub your eyes.  Wash your hands often. Dry your hands with a clean towel.  Keep your face, scalp, and eyebrows clean.  Avoid wearing eye makeup.  Apply a warm, moist compress to the eyelid 4-6 times a day for 10-15 minutes at a time. This will help to open any blocked glands and help to reduce redness and swelling.  Apply over-the-counter and prescription medicines only as told by your health care provider.  If the chalazion does not break open (rupture) on its own in a month, return to your health care provider.  Keep all follow-up appointments as told by your health care provider. This is important. SEEK MEDICAL CARE IF:  Your eyelid has not improved in 4 weeks.  Your eyelid is getting worse.  You have a fever.  The chalazion does not rupture on its own with home treatment in a month. SEEK IMMEDIATE MEDICAL CARE IF:  You  have pain in your eye.  Your vision changes.  The chalazion becomes painful or red  The chalazion gets bigger.   This information is not intended to replace advice given to you by your health care provider. Make sure you discuss any questions you have with your health care provider.   Document Released: 10/28/2000 Document Revised: 07/22/2015 Document Reviewed: 02/23/2015 Elsevier Interactive Patient Education [employer]2016 Elsevier Inc.

## 2016-03-19 ENCOUNTER — Emergency Department (HOSPITAL_COMMUNITY)
Admission: EM | Admit: 2016-03-19 | Discharge: 2016-03-19 | Disposition: A | Discharge status: Home / Self Care | Payer: PRIVATE HEALTH INSURANCE | Attending: Emergency Medicine | Admitting: Emergency Medicine

## 2016-03-19 ENCOUNTER — Encounter (HOSPITAL_COMMUNITY): Payer: Self-pay | Admitting: Emergency Medicine

## 2016-03-19 DIAGNOSIS — J069 Acute upper respiratory infection, unspecified: Secondary | ICD-10-CM

## 2016-03-19 DIAGNOSIS — R300 Dysuria: Secondary | ICD-10-CM

## 2016-03-19 DIAGNOSIS — Z792 Long term (current) use of antibiotics: Secondary | ICD-10-CM | POA: Insufficient documentation

## 2016-03-19 DIAGNOSIS — Z79899 Other long term (current) drug therapy: Secondary | ICD-10-CM | POA: Insufficient documentation

## 2016-03-19 DIAGNOSIS — F329 Major depressive disorder, single episode, unspecified: Secondary | ICD-10-CM | POA: Insufficient documentation

## 2016-03-19 DIAGNOSIS — F17210 Nicotine dependence, cigarettes, uncomplicated: Secondary | ICD-10-CM | POA: Insufficient documentation

## 2016-03-19 LAB — URINALYSIS, ROUTINE W REFLEX MICROSCOPIC
Bilirubin Urine: NEGATIVE
Glucose, UA: NEGATIVE mg/dL
Hgb urine dipstick: NEGATIVE
Ketones, ur: NEGATIVE mg/dL
Leukocytes, UA: NEGATIVE
Nitrite: NEGATIVE
Protein, ur: NEGATIVE mg/dL
Specific Gravity, Urine: 1.005 (ref 1.005–1.030)
pH: 6.5 (ref 5.0–8.0)

## 2016-03-19 MED ORDER — BENZONATATE 100 MG PO CAPS: 100.0000 mg | ORAL_CAPSULE | Freq: Three times a day (TID) | ORAL | Status: DC

## 2016-03-19 MED ORDER — ALBUTEROL SULFATE HFA 108 (90 BASE) MCG/ACT IN AERS
1.0000 | INHALATION_SPRAY | Freq: Once | RESPIRATORY_TRACT | Status: AC
  Administered 2016-03-19: 1 via RESPIRATORY_TRACT
  Filled 2016-03-19: qty 6.7

## 2016-03-19 MED ORDER — PHENAZOPYRIDINE HCL 200 MG PO TABS: 200.0000 mg | ORAL_TABLET | Freq: Three times a day (TID) | ORAL | Status: DC

## 2016-03-19 NOTE — ED Provider Notes (Signed)
CSN: [id number]     Arrival date & time 03/19/16  1031 History   First MD Initiated Contact with Patient 03/19/16 1102     Chief Complaint  Patient presents with  . Cough  . Dysuria     (Consider location/radiation/quality/duration/timing/severity/associated sxs/prior Treatment) HPI  Emily Briggs is a 20 y.o F With no significant past medical history who presents the ED today complaining of cough and dysuria. Patient states that for the last week she has been and nonproductive cough and nasal congestion. Patient states she is coughing so hard that she is causing herself to gag and is making her chest hurt. She has tried taking cough drops and Tylenol with relief of her symptoms. 2 days ago the patient also started experiencing dysuria, increased urgency and frequency. Patient also states that her urine has a foul odor to it. She has tried taking AZO and drinking cranberry juice without relief. She denies fevers, chills, otalgia, neck pain or stiffness, abdominal pain, vomiting, vaginal discharge. Patient is a current every day smoker.  Past Medical History  Diagnosis Date  . Bronchial spasm   . Depression   . Anxiety    Past Surgical History  Procedure Laterality Date  . Tonsillectomy     History reviewed. No pertinent family history. Social History  Substance Use Topics  . Smoking status: Current Every Day Smoker -- 0.50 packs/day    Types: Cigarettes    Last Attempt to Quit: 10/31/2011  . Smokeless tobacco: None  . Alcohol Use: No   OB History    No data available     Review of Systems  All other systems reviewed and are negative.     Allergies  Sulfa drugs cross reactors and Citalopram  Home Medications   Prior to Admission medications   Medication Sig Start Date End Date Taking? Authorizing Provider  etonogestrel (NEXPLANON) 68 MG IMPL implant 1 each by Subdermal route once. 02/2014    Historical Provider, MD  gentamicin (GARAMYCIN) 0.3 % ophthalmic solution  Place 2 drops into the left eye every 4 (four) hours. 02/12/16   Abigail Harris, PA-C  ondansetron (ZOFRAN ODT) 4 MG disintegrating tablet Take 1 tablet (4 mg total) by mouth every 8 (eight) hours as needed for nausea or vomiting. 01/15/16   Alvira MondayErin Schlossman, MD   BP 120/82 mmHg  Pulse 82  Temp(Src) 98.4 F (36.9 C)  Resp 20  SpO2 100%  LMP 02/14/2016 Physical Exam  Constitutional: She is oriented to person, place, and time. She appears well-developed and well-nourished. No distress.  HENT:  Head: Normocephalic and atraumatic.  Mouth/Throat: Oropharynx is clear and moist. No oropharyngeal exudate.  Eyes: Conjunctivae and EOM are normal. Pupils are equal, round, and reactive to light. Right eye exhibits no discharge. Left eye exhibits no discharge. No scleral icterus.  Neck: Neck supple.  No meningismus   Cardiovascular: Normal rate, regular rhythm, normal heart sounds and intact distal pulses.  Exam reveals no gallop and no friction rub.   No murmur heard. Pulmonary/Chest: Effort normal and breath sounds normal. No respiratory distress. She has no wheezes. She has no rales. She exhibits no tenderness.  Abdominal: Soft. Bowel sounds are normal. She exhibits no distension and no mass. There is no tenderness. There is no rebound and no guarding.  Neurological: She is alert and oriented to person, place, and time. Coordination normal.  Skin: Skin is warm and dry. No rash noted. She is not diaphoretic. No erythema. No pallor.  Psychiatric: She has  a normal mood and affect. Her behavior is normal.  Nursing note and vitals reviewed.   ED Course  Procedures (including critical care time) Labs Review Labs Reviewed  URINE CULTURE  URINALYSIS, ROUTINE W REFLEX MICROSCOPIC (NOT AT [facility])    Imaging Review No results found. I have personally reviewed and evaluated these images and lab results as part of my medical decision-making.   EKG Interpretation None      MDM   Final diagnoses:   Dysuria  URI (upper respiratory infection)    Patients symptoms are consistent with URI, likely viral etiology. Lungs CTAB. Pt afebrile and non-toxic, non-septic appearing. No CXR indicated at this time. Doubt PNA. Discussed that antibiotics are not indicated for viral infections. UA also negative for infection. Will send for culture and place pt on pyridium. Pt also given Tessalon of cough. Encouraged smoking cessation. Verbalizes understanding and is agreeable with plan. Pt is hemodynamically stable & in NAD prior to dc.     Lester Kinsman Blacklake, PA-C 03/19/16 1458  Pricilla Loveless, MD 03/19/16 [phone]

## 2016-03-19 NOTE — Discharge Instructions (Signed)
Cough, Adult Coughing is a reflex that clears your throat and your airways. Coughing helps to heal and protect your lungs. It is normal to cough occasionally, but a cough that happens with other symptoms or lasts a long time may be a sign of a condition that needs treatment. A cough may last only 2-3 weeks (acute), or it may last longer than 8 weeks (chronic). CAUSES Coughing is commonly caused by:  Breathing in substances that irritate your lungs.  A viral or bacterial respiratory infection.  Allergies.  Asthma.  Postnasal drip.  Smoking.  Acid backing up from the stomach into the esophagus (gastroesophageal reflux).  Certain medicines.  Chronic lung problems, including COPD (or rarely, lung cancer).  Other medical conditions such as heart failure. HOME CARE INSTRUCTIONS  Pay attention to any changes in your symptoms. Take these actions to help with your discomfort:  Take medicines only as told by your health care provider.  If you were prescribed an antibiotic medicine, take it as told by your health care provider. Do not stop taking the antibiotic even if you start to feel better.  Talk with your health care provider before you take a cough suppressant medicine.  Drink enough fluid to keep your urine clear or pale yellow.  If the air is dry, use a cold steam vaporizer or humidifier in your bedroom or your home to help loosen secretions.  Avoid anything that causes you to cough at work or at home.  If your cough is worse at night, try sleeping in a semi-upright position.  Avoid cigarette smoke. If you smoke, quit smoking. If you need help quitting, ask your health care provider.  Avoid caffeine.  Avoid alcohol.  Rest as needed. SEEK MEDICAL CARE IF:   You have new symptoms.  You cough up pus.  Your cough does not get better after 2-3 weeks, or your cough gets worse.  You cannot control your cough with suppressant medicines and you are losing sleep.  You  develop pain that is getting worse or pain that is not controlled with pain medicines.  You have a fever.  You have unexplained weight loss.  You have night sweats. SEEK IMMEDIATE MEDICAL CARE IF:  You cough up blood.  You have difficulty breathing.  Your heartbeat is very fast.   This information is not intended to replace advice given to you by your health care provider. Make sure you discuss any questions you have with your health care provider.   Document Released: 04/29/2011 Document Revised: 07/22/2015 Document Reviewed: 01/07/2015 Elsevier Interactive Patient Education 2016 [employer]  Take prescribed medications as prescribed. Encouraged smoking cessation. He of Rest and Drink Plenty of Fluids. Recommend OTC Mucinex for decongestant. Follow up with your PCP if your symptoms aren't improved. Return to the emergency department if you experience worsening of her symptoms, fevers, chills, difficulty breathing.

## 2016-03-19 NOTE — ED Notes (Signed)
Pt c/o of cough, non-productive. Pt states she is also having painful and frequent urination.

## 2016-03-21 LAB — URINE CULTURE: Culture: >=100000 — AB

## 2016-03-22 ENCOUNTER — Telehealth (HOSPITAL_BASED_OUTPATIENT_CLINIC_OR_DEPARTMENT_OTHER): Payer: Self-pay | Admitting: Emergency Medicine

## 2016-03-22 NOTE — Progress Notes (Signed)
ED Antimicrobial Stewardship Positive Culture Follow Up   Hebert SohoBrooklynn Hall is an 20 y.o. female who [facility] with a chief complaint of  Chief Complaint  Patient presents with  . Cough  . Dysuria    Recent Results (from the past 720 hour(s))  Urine culture     Status: Abnormal   Collection Time: 03/19/16 11:35 AM  Result Value Ref Range Status   Specimen Description URINE, CLEAN CATCH  Final   Special Requests NONE  Final   Culture (A)  Final    >=100,000 COLONIES/mL STAPHYLOCOCCUS SPECIES (COAGULASE NEGATIVE)   Report Status 03/21/2016 FINAL  Final   Organism ID, Bacteria STAPHYLOCOCCUS SPECIES (COAGULASE NEGATIVE) (A)  Final      Susceptibility   Staphylococcus species (coagulase negative) - MIC*    CIPROFLOXACIN <=0.5 SENSITIVE Sensitive     GENTAMICIN <=0.5 SENSITIVE Sensitive     NITROFURANTOIN <=16 SENSITIVE Sensitive     OXACILLIN 2 RESISTANT Resistant     TETRACYCLINE <=1 SENSITIVE Sensitive     VANCOMYCIN <=0.5 SENSITIVE Sensitive     TRIMETH/SULFA <=10 SENSITIVE Sensitive     CLINDAMYCIN <=0.25 SENSITIVE Sensitive     RIFAMPIN <=0.5 SENSITIVE Sensitive     Inducible Clindamycin NEGATIVE Sensitive     * >=100,000 COLONIES/mL STAPHYLOCOCCUS SPECIES (COAGULASE NEGATIVE)    UA negative and culture is a contaminant. No treatment indicated.  ED Provider: Burna FortsJeff Hedges, PA-C  Nilza Eaker L. Roseanne RenoStewart, PharmD PGY2 Infectious Diseases Pharmacy Resident Pager: [phone] 03/22/2016 8:46 AM

## 2016-03-22 NOTE — Telephone Encounter (Signed)
Post ED Visit - Positive Culture Follow-up  Culture report reviewed by antimicrobial stewardship pharmacist:  []  Emily Briggs, Pharm.D. []  Emily Briggs, Pharm.D., BCPS []  Emily Briggs, Pharm.D. []  Emily Briggs, Pharm.D., BCPS []  [place] Valley StreamMinh Briggs, [address] BoulevardPharm.D., BCPS, AAHIVP []  Emily Briggs, Pharm.D., BCPS, AAHIVP [x]  Emily Briggs, Pharm.D. []  Emily Briggs, [address]  Positive urine culture Treated with none, asymptomatic,  no further patient follow-up is required at this time.  Emily Briggs, Emily Briggs 03/22/2016, 9:10 AM

## 2016-06-16 ENCOUNTER — Encounter (HOSPITAL_COMMUNITY): Payer: Self-pay | Admitting: Emergency Medicine

## 2016-06-16 ENCOUNTER — Emergency Department (HOSPITAL_COMMUNITY)
Admission: EM | Admit: 2016-06-16 | Discharge: 2016-06-16 | Disposition: A | Discharge status: Home / Self Care | Payer: PRIVATE HEALTH INSURANCE | Attending: Emergency Medicine | Admitting: Emergency Medicine

## 2016-06-16 DIAGNOSIS — N12 Tubulo-interstitial nephritis, not specified as acute or chronic: Secondary | ICD-10-CM | POA: Insufficient documentation

## 2016-06-16 DIAGNOSIS — F17210 Nicotine dependence, cigarettes, uncomplicated: Secondary | ICD-10-CM | POA: Insufficient documentation

## 2016-06-16 DIAGNOSIS — N390 Urinary tract infection, site not specified: Secondary | ICD-10-CM | POA: Insufficient documentation

## 2016-06-16 DIAGNOSIS — Z79899 Other long term (current) drug therapy: Secondary | ICD-10-CM | POA: Insufficient documentation

## 2016-06-16 LAB — WET PREP, GENITAL
Clue Cells Wet Prep HPF POC: NONE SEEN
Sperm: NONE SEEN
Trich, Wet Prep: NONE SEEN
Yeast Wet Prep HPF POC: NONE SEEN

## 2016-06-16 LAB — URINALYSIS, ROUTINE W REFLEX MICROSCOPIC
Bilirubin Urine: NEGATIVE
Glucose, UA: NEGATIVE mg/dL
Hgb urine dipstick: NEGATIVE
Ketones, ur: NEGATIVE mg/dL
Nitrite: NEGATIVE
Protein, ur: NEGATIVE mg/dL
Specific Gravity, Urine: 1.012 (ref 1.005–1.030)
pH: 6 (ref 5.0–8.0)

## 2016-06-16 LAB — URINE MICROSCOPIC-ADD ON: RBC / HPF: NONE SEEN RBC/hpf (ref 0–5)

## 2016-06-16 LAB — RAPID HIV SCREEN (HIV 1/2 AB+AG)
HIV 1/2 Antibodies: NONREACTIVE
HIV-1 P24 Antigen - HIV24: NONREACTIVE

## 2016-06-16 LAB — PREGNANCY, URINE: Preg Test, Ur: NEGATIVE

## 2016-06-16 MED ORDER — CIPROFLOXACIN HCL 500 MG PO TABS: 500.0000 mg | ORAL_TABLET | Freq: Two times a day (BID) | ORAL | 0 refills | Status: DC

## 2016-06-16 MED ORDER — SODIUM CHLORIDE 0.9 % IV BOLUS (SEPSIS)
1000.0000 mL | Freq: Once | INTRAVENOUS | Status: AC
  Administered 2016-06-16: 1000 mL via INTRAVENOUS

## 2016-06-16 MED ORDER — CEFTRIAXONE SODIUM 1 G IJ SOLR
1.0000 g | Freq: Once | INTRAMUSCULAR | Status: AC
  Administered 2016-06-16: 1 g via INTRAVENOUS
  Filled 2016-06-16: qty 10

## 2016-06-16 NOTE — ED Provider Notes (Signed)
[facility]-EMERGENCY DEPT Provider Note   CSN: [id number] Arrival date & time: 06/16/16  1451  First Provider Contact:  None       History   Chief Complaint Chief Complaint  Patient presents with  . Pyelonephritis  . Abdominal Pain    HPI Emily Briggs is a 20 y.o. female.  HPI   20 year old female presenting with c/o dysuria.  Pt report 2 weeks hx of burning on urination worsening for the past few days.  Report having strong odor and having increase lower abdominal pain radiates to her lower back.  Movement and urination makes it worse, nothing makes it better, no specific treatment tried.  Report a few loose stools for the past few days.  No blood in stool.  LMP 7/15, has been sexually active.  Has hx of bladder/kidney infection in the past.  Denies fever, chills, vaginal bleeding, vaginal discharge, rash, pain with sexual activity, n/v/d, anorexia.   Pt is on Nexplanon.      Past Medical History:  Diagnosis Date  . Anxiety   . Bronchial spasm   . Depression     There are no active problems to display for this patient.   Past Surgical History:  Procedure Laterality Date  . TONSILLECTOMY      OB History    No data available       Home Medications    Prior to Admission medications   Medication Sig Start Date End Date Taking? Authorizing Provider  albuterol (PROVENTIL HFA;VENTOLIN HFA) 108 (90 Base) MCG/ACT inhaler Inhale 2 puffs into the lungs every 6 (six) hours as needed for wheezing or shortness of breath.   Yes Historical Provider, MD  benzonatate (TESSALON) 100 MG capsule Take 1 capsule (100 mg total) by mouth every 8 (eight) hours. Patient not taking: Reported on 06/16/2016 03/19/16   Samantha Tripp Dowless, PA-C  etonogestrel (NEXPLANON) 68 MG IMPL implant 1 each by Subdermal route once. 02/2014    Historical Provider, MD  gentamicin (GARAMYCIN) 0.3 % ophthalmic solution Place 2 drops into the left eye every 4 (four) hours. Patient not taking: Reported on  06/16/2016 02/12/16   Arthor Captain, PA-C  ondansetron (ZOFRAN ODT) 4 MG disintegrating tablet Take 1 tablet (4 mg total) by mouth every 8 (eight) hours as needed for nausea or vomiting. Patient not taking: Reported on 06/16/2016 01/15/16   Alvira Monday, MD  phenazopyridine (PYRIDIUM) 200 MG tablet Take 1 tablet (200 mg total) by mouth 3 (three) times daily. Patient not taking: Reported on 06/16/2016 03/19/16   Dub Mikes, PA-C    Family History No family history on file.  Social History Social History  Substance Use Topics  . Smoking status: Current Every Day Smoker    Packs/day: 0.50    Types: Cigarettes    Last attempt to quit: 10/31/2011  . Smokeless tobacco: Never Used  . Alcohol use No     Allergies   Sulfa drugs cross reactors and Citalopram   Review of Systems Review of Systems  All other systems reviewed and are negative.    Physical Exam Updated Vital Signs BP (!) 107/54 (BP Location: Left Arm)   Pulse 85   Temp 99.4 F (37.4 C) (Oral)   Resp 18   LMP 05/28/2016   SpO2 100%   Physical Exam  Constitutional: She appears well-developed and well-nourished. No distress.  HENT:  Head: Atraumatic.  Eyes: Conjunctivae are normal.  Neck: Neck supple.  Cardiovascular: Normal rate and regular rhythm.  Pulmonary/Chest: Effort normal and breath sounds normal.  Abdominal: Soft. There is tenderness (Mild suprapubic tenderness without guarding or rebound tenderness.).  Genitourinary:  Genitourinary Comments: Pelvic exam: RN in room as chaperone, external female genitalia normal with no signs of lesions or injuries. Speculum exam shows normal cervix with no obvious discharge. Bimanual exam with no adnexal tenderness, no cervical motion tenderness, uterus normal size and nontender, no masses appreciated. The external cervical os is closed.   Musculoskeletal: She exhibits tenderness (Mild tenderness to low back on palpation without frank CVA tenderness).    Neurological: She is alert.  Skin: No rash noted.  Psychiatric: She has a normal mood and affect.  Nursing note and vitals reviewed.    ED Treatments / Results  Labs (all labs ordered are listed, but only abnormal results are displayed) Labs Reviewed  WET PREP, GENITAL - Abnormal; Notable for the following:       Result Value   WBC, Wet Prep HPF POC FEW (*)    All other components within normal limits  URINALYSIS, ROUTINE W REFLEX MICROSCOPIC (NOT AT [facility]) - Abnormal; Notable for the following:    Leukocytes, UA SMALL (*)    All other components within normal limits  URINE MICROSCOPIC-ADD ON - Abnormal; Notable for the following:    Squamous Epithelial / LPF 6-30 (*)    Bacteria, UA MANY (*)    All other components within normal limits  RAPID HIV SCREEN (HIV 1/2 AB+AG)  PREGNANCY, URINE  RPR  GC/CHLAMYDIA PROBE AMP ([place]) NOT AT [facility]    EKG  EKG Interpretation None       Radiology No results found.  Procedures Procedures (including critical care time)  Medications Ordered in ED Medications - No data to display   Initial Impression / Assessment and Plan / ED Course  I have reviewed the triage vital signs and the nursing notes.  Pertinent labs & imaging results that were available during my care of the patient were reviewed by me and considered in my medical decision making (see chart for details).  Clinical Course    BP 102/66   Pulse (!) 53   Temp 99.4 F (37.4 C) (Oral)   Resp 18   LMP 05/28/2016   SpO2 100%    Final Clinical Impressions(s) / ED Diagnoses   Final diagnoses:  UTI (lower urinary tract infection)    New Prescriptions New Prescriptions   CIPROFLOXACIN (CIPRO) 500 MG TABLET    Take 1 tablet (500 mg total) by mouth 2 (two) times daily. One po bid x 7 days   7:03 PM Patient here with dysuria ongoing for about 2 weeks and now having low back pain. Symptoms and potential pyelonephritis. She is afebrile, vital signs stable, no  vomiting and nonsurgical abdomen. No evidence of PID on pelvic exam.  8:33 PM No evidence concerning for PID on exam. Will treat patient with Cipro for her UTI/arthritis. Patient otherwise stable for discharge. Return precaution discussed.    Fayrene Helper, PA-C 06/16/16 2034    Rolland Porter, MD 06/28/16 [phone]

## 2016-06-16 NOTE — ED Triage Notes (Signed)
Patient states she has abdominal pain and burning when urinating.  She states her urine is dark in color and cloudy.  Denies N/V.  Denies fever or chilis.

## 2016-06-17 LAB — GC/CHLAMYDIA PROBE AMP (~~LOC~~) NOT AT ARMC
Chlamydia: NEGATIVE
Neisseria Gonorrhea: NEGATIVE

## 2016-06-17 LAB — SYPHILIS: RPR W/REFLEX TO RPR TITER AND TREPONEMAL ANTIBODIES, TRADITIONAL SCREENING AND DIAGNOSIS ALGORITHM: RPR Ser Ql: NONREACTIVE

## 2016-08-08 ENCOUNTER — Encounter: Payer: Self-pay | Admitting: Family Medicine

## 2016-08-08 ENCOUNTER — Ambulatory Visit (INDEPENDENT_AMBULATORY_CARE_PROVIDER_SITE_OTHER): Payer: No Typology Code available for payment source | Admitting: Family Medicine

## 2016-08-08 VITALS — BP 121/61 | HR 69 | Temp 99.2°F | Resp 14 | Ht 65.0 in | Wt 118.0 lb

## 2016-08-08 DIAGNOSIS — F418 Other specified anxiety disorders: Secondary | ICD-10-CM

## 2016-08-08 DIAGNOSIS — Z0000 Encounter for general adult medical examination without abnormal findings: Secondary | ICD-10-CM

## 2016-08-08 DIAGNOSIS — K0889 Other specified disorders of teeth and supporting structures: Secondary | ICD-10-CM

## 2016-08-08 LAB — CBC WITH DIFFERENTIAL/PLATELET
Basophils Absolute: 0 cells/uL (ref 0–200)
Basophils Relative: 0 %
Eosinophils Absolute: 84 cells/uL (ref 15–500)
Eosinophils Relative: 1 %
HCT: 40 % (ref 35.0–45.0)
Hemoglobin: 13 g/dL (ref 11.7–15.5)
Lymphocytes Relative: 47 %
Lymphs Abs: 3948 cells/uL — ABNORMAL HIGH (ref 850–3900)
MCH: 27.1 pg (ref 27.0–33.0)
MCHC: 32.5 g/dL (ref 32.0–36.0)
MCV: 83.3 fL (ref 80.0–100.0)
MPV: 10.5 fL (ref 7.5–12.5)
Monocytes Absolute: 672 cells/uL (ref 200–950)
Monocytes Relative: 8 %
Neutro Abs: 3696 cells/uL (ref 1500–7800)
Neutrophils Relative %: 44 %
Platelets: 271 10*3/uL (ref 140–400)
RBC: 4.8 MIL/uL (ref 3.80–5.10)
RDW: 14.6 % (ref 11.0–15.0)
WBC: 8.4 10*3/uL (ref 3.8–10.8)

## 2016-08-08 LAB — COMPLETE METABOLIC PANEL WITH GFR
ALT: 15 U/L (ref 6–29)
AST: 23 U/L (ref 10–30)
Albumin: 4.5 g/dL (ref 3.6–5.1)
Alkaline Phosphatase: 46 U/L (ref 33–115)
BUN: 9 mg/dL (ref 7–25)
CO2: 25 mmol/L (ref 20–31)
Calcium: 9.6 mg/dL (ref 8.6–10.2)
Chloride: 107 mmol/L (ref 98–110)
Creat: 0.77 mg/dL (ref 0.50–1.10)
GFR, Est African American: >89 mL/min (ref >=60)
GFR, Est Non African American: >89 mL/min (ref >=60)
Glucose, Bld: 62 mg/dL — ABNORMAL LOW (ref 65–99)
Potassium: 4.2 mmol/L (ref 3.5–5.3)
Sodium: 138 mmol/L (ref 135–146)
Total Bilirubin: 0.3 mg/dL (ref 0.2–1.2)
Total Protein: 6.9 g/dL (ref 6.1–8.1)

## 2016-08-08 MED ORDER — AMOXICILLIN 500 MG PO CAPS: 500.0000 mg | ORAL_CAPSULE | Freq: Three times a day (TID) | ORAL | 0 refills | Status: DC

## 2016-08-09 LAB — HEMOGLOBIN A1C
Hgb A1c MFr Bld: 5 % (ref <5.7)
Mean Plasma Glucose: 97 mg/dL

## 2016-08-10 DIAGNOSIS — F418 Other specified anxiety disorders: Secondary | ICD-10-CM | POA: Insufficient documentation

## 2016-08-10 DIAGNOSIS — K0889 Other specified disorders of teeth and supporting structures: Secondary | ICD-10-CM | POA: Insufficient documentation

## 2016-08-10 NOTE — Patient Instructions (Signed)
Will set up dental referral Follow-up in three months

## 2016-08-10 NOTE — Progress Notes (Signed)
Emily Briggs, is a 20 y.o. female  ZOX:[id number]:[id number]  WJX:[id number]  DOB - 02/27/1996  CC:  Chief Complaint  Patient presents with  . Establish Care    needs obgyn for nexpalon removal.   . Dental Pain    pain and broken tooth.        HPI: Emily Briggs is a 20 y.o. female here to establish care. She has two issues today.  1. Needs dental referral 2. Needs referral for Implanton removal. We has no chronic illnesses. Other than a vague history of albuterol use. Has not used in a long time. She does have a history of anxiety. She reports smoking 1/2 pk cigarettes, using occ. Alcohol. Denies drug use.   Health maintenance: she declines influenza, reports Tdap is UTD. She does need a PAP    Allergies  Allergen Reactions  . Sulfa Drugs Cross Reactors Other (See Comments)    "migraine"  . Citalopram Rash and Other (See Comments)    headaches  . Prozac [Fluoxetine Hcl] Rash    Skin pealing    Past Medical History:  Diagnosis Date  . Anxiety   . Bronchial spasm   . Depression    Current Outpatient Prescriptions on File Prior to Visit  Medication Sig Dispense Refill  . albuterol (PROVENTIL HFA;VENTOLIN HFA) 108 (90 Base) MCG/ACT inhaler Inhale 2 puffs into the lungs every 6 (six) hours as needed for wheezing or shortness of breath.    . etonogestrel (NEXPLANON) 68 MG IMPL implant 1 each by Subdermal route once. 02/2014     No current facility-administered medications on file prior to visit.    History reviewed. No pertinent family history. Social History   Social History  . Marital status: Single    Spouse name: N/A  . Number of children: N/A  . Years of education: N/A   Occupational History  . Not on file.   Social History Main Topics  . Smoking status: Current Every Day Smoker    Packs/day: 0.50    Types: Cigarettes    Last attempt to quit: 10/31/2011  . Smokeless tobacco: Never Used  . Alcohol use Yes     Comment: occ  . Drug use: No  . Sexual activity:  Yes    Birth control/ protection: Implant   Other Topics Concern  . Not on file   Social History Narrative  . No narrative on file    Review of Systems: Constitutional: Negative Skin: Positive for occasional sebaccus cyst HENT: Negative  Eyes: Negative  Neck: Negative Respiratory: Positive for shortness of breath with exercise and anxiety Cardiovascular: Positive for occ palpitations with anxiety Gastrointestinal: Negative Genitourinary: Positive for occ nausea and diarrhea Musculoskeletal: Positive for back and neck.  Neurological: Positive for headaches about twice a month Hematological: Negative  Psychiatric/Behavioral: Positive for depression and anxiety   Objective:   Vitals:   08/08/16 1427  BP: 121/61  Pulse: 69  Resp: 14  Temp: 99.2 F (37.3 C)    Physical Exam: Constitutional: Patient appears well-developed and well-nourished. No distress. HENT: Normocephalic, atraumatic, External right and left ear normal. Oropharynx is clear and moist. Teeth in poor repair Eyes: Conjunctivae and EOM are normal. PERRLA, no scleral icterus. Neck: Normal ROM. Neck supple. No lymphadenopathy, No thyromegaly. CVS: RRR, S1/S2 +, no murmurs, no gallops, no rubs Pulmonary: Effort and breath sounds normal, no stridor, rhonchi, wheezes, rales.  Abdominal: Soft. Normoactive BS,, no distension, tenderness, rebound or guarding.  Musculoskeletal: Normal range of motion. No edema  and no tenderness.  Neuro: Alert.Normal muscle tone coordination. Non-focal Skin: Skin is warm and dry. No rash noted. Not diaphoretic. No erythema. No pallor. Psychiatric: Normal mood and affect. Behavior, judgment, thought content normal.  Lab Results  Component Value Date   WBC 8.4 08/08/2016   HGB 13.0 08/08/2016   HCT 40.0 08/08/2016   MCV 83.3 08/08/2016   PLT 271 08/08/2016   Lab Results  Component Value Date   CREATININE 0.77 08/08/2016   BUN 9 08/08/2016   NA 138 08/08/2016   K 4.2  08/08/2016   CL 107 08/08/2016   CO2 25 08/08/2016    Lab Results  Component Value Date   HGBA1C 5.0 08/08/2016   Lipid Panel  No results found for: CHOL, TRIG, HDL, CHOLHDL, VLDL, LDLCALC     Assessment and plan:   1. Pain, dental -Ambulary referral to dentist    2. Healthcare maintenance - Hemoglobin A1c - CBC with Differential - COMPLETE METABOLIC PANEL WITH GFR   Return in about 3 months (around 11/07/2016).  The patient was given clear instructions to go to ER or return to medical center if symptoms don't improve, worsen or new problems develop. The patient verbalized understanding.    Henrietta Hoover FNP  08/10/2016, 1:12 PM

## 2016-10-24 ENCOUNTER — Ambulatory Visit (INDEPENDENT_AMBULATORY_CARE_PROVIDER_SITE_OTHER): Payer: No Typology Code available for payment source | Admitting: Family Medicine

## 2016-10-24 ENCOUNTER — Encounter: Payer: Self-pay | Admitting: Family Medicine

## 2016-10-24 VITALS — BP 110/64 | HR 66 | Temp 98.9°F | Resp 14 | Ht 65.0 in | Wt 118.0 lb

## 2016-10-24 DIAGNOSIS — Z309 Encounter for contraceptive management, unspecified: Secondary | ICD-10-CM

## 2016-10-24 DIAGNOSIS — Z23 Encounter for immunization: Secondary | ICD-10-CM

## 2016-10-24 NOTE — Patient Instructions (Signed)
May use afrin nasal spray once a day at bedtime for 5 days. May use nasal saline as much as needed.

## 2016-10-24 NOTE — Progress Notes (Signed)
Emily Briggs, is a 20 y.o. female  ZOX:[id number]:[id number]  WJX:[id number]  DOB - 01/08/1996  CC:  Chief Complaint  Patient presents with  . URI    feels like she has bronchitis, complains of cough, nasal congestion, feverish, sore throat, chest feels heavy  . referral to OBGYN    would like implant removed has orange card       HPI: Emily Briggs is a 20 y.o. female here complaining of a one week history of ST (better) nasal congestion, and cough.  She is using OTC symptomatic treatment.  She denies ear ache, fever, facial pain.   Allergies  Allergen Reactions  . Sulfa Drugs Cross Reactors Other (See Comments)    "migraine"  . Citalopram Rash and Other (See Comments)    headaches  . Prozac [Fluoxetine Hcl] Rash    Skin pealing    Past Medical History:  Diagnosis Date  . Anxiety   . Bronchial spasm   . Depression    Current Outpatient Prescriptions on File Prior to Visit  Medication Sig Dispense Refill  . etonogestrel (NEXPLANON) 68 MG IMPL implant 1 each by Subdermal route once. 02/2014    . albuterol (PROVENTIL HFA;VENTOLIN HFA) 108 (90 Base) MCG/ACT inhaler Inhale 2 puffs into the lungs every 6 (six) hours as needed for wheezing or shortness of breath.    Marland Kitchen. amoxicillin (AMOXIL) 500 MG capsule Take 1 capsule (500 mg total) by mouth 3 (three) times daily. (Patient not taking: Reported on 10/24/2016) 30 capsule 0   No current facility-administered medications on file prior to visit.    History reviewed. No pertinent family history. Social History   Social History  . Marital status: Single    Spouse name: N/A  . Number of children: N/A  . Years of education: N/A   Occupational History  . Not on file.   Social History Main Topics  . Smoking status: Current Every Day Smoker    Packs/day: 0.50    Types: Cigarettes    Last attempt to quit: 10/31/2011  . Smokeless tobacco: Never Used  . Alcohol use Yes     Comment: occ  . Drug use: No  . Sexual activity: Yes   Birth control/ protection: Implant   Other Topics Concern  . Not on file   Social History Narrative  . No narrative on file    Review of Systems: Constitutional: Negative Skin: Negative HENT: + for nasal congestion Eyes: Negative  Neck: Negative Respiratory:+cough Cardiovascular: Negative Gastrointestinal: Negative Genitourinary: + frequency and dysuria. Musculoskeletal: Negative   Neurological: Negative for Hematological: Negative  Psychiatric/Behavioral: Negative    Objective:   Vitals:   10/24/16 1339  BP: 110/64  Pulse: 66  Resp: 14  Temp: 98.9 F (37.2 C)    Physical Exam: Constitutional: Patient appears well-developed and well-nourished. No distress. HENT: Normocephalic, atraumatic, External right and left ear normal. Oropharynx is clear and moist.  Eyes: Conjunctivae and EOM are normal. PERRLA, no scleral icterus. Neck: Normal ROM. Neck supple. No lymphadenopathy, No thyromegaly. CVS: RRR, S1/S2 +, no murmurs, no gallops, no rubs Pulmonary: Effort and breath sounds normal, no stridor, rhonchi, wheezes, rales.  Abdominal: Soft. Normoactive BS,, no distension, tenderness, rebound or guarding.  Musculoskeletal: Normal range of motion. No edema and no tenderness.  Neuro: Alert.Normal muscle tone coordination. Non-focal Skin: Skin is warm and dry. No rash noted. Not diaphoretic. No erythema. No pallor. Psychiatric: Normal mood and affect. Behavior, judgment, thought content normal.  Lab Results  Component Value  Date   WBC 8.4 08/08/2016   HGB 13.0 08/08/2016   HCT 40.0 08/08/2016   MCV 83.3 08/08/2016   PLT 271 08/08/2016   Lab Results  Component Value Date   CREATININE 0.77 08/08/2016   BUN 9 08/08/2016   NA 138 08/08/2016   K 4.2 08/08/2016   CL 107 08/08/2016   CO2 25 08/08/2016    Lab Results  Component Value Date   HGBA1C 5.0 08/08/2016   Lipid Panel  No results found for: CHOL, TRIG, HDL, CHOLHDL, VLDL, LDLCALC      Assessment and  plan:   1. Encounter for contraceptive management, unspecified type  - Ambulatory referral to Gynecology  2. Need for prophylactic vaccination and inoculation against influenza  - Flu Vaccine QUAD 36+ mos PF IM (Fluarix & Fluzone Quad PF)   Return if symptoms worsen or fail to improve.  The patient was given clear instructions to go to ER or return to medical center if symptoms don't improve, worsen or new problems develop. The patient verbalized understanding.    Henrietta HooverLinda C Bernhardt FNP  10/24/2016, 2:12 PM

## 2016-12-06 ENCOUNTER — Encounter: Payer: Self-pay | Admitting: Obstetrics & Gynecology

## 2016-12-10 ENCOUNTER — Encounter (HOSPITAL_BASED_OUTPATIENT_CLINIC_OR_DEPARTMENT_OTHER): Payer: Self-pay | Admitting: Emergency Medicine

## 2016-12-10 ENCOUNTER — Emergency Department (HOSPITAL_BASED_OUTPATIENT_CLINIC_OR_DEPARTMENT_OTHER)
Admission: EM | Admit: 2016-12-10 | Discharge: 2016-12-10 | Disposition: A | Discharge status: Home / Self Care | Payer: PRIVATE HEALTH INSURANCE | Attending: Emergency Medicine | Admitting: Emergency Medicine

## 2016-12-10 DIAGNOSIS — F17210 Nicotine dependence, cigarettes, uncomplicated: Secondary | ICD-10-CM | POA: Insufficient documentation

## 2016-12-10 DIAGNOSIS — R112 Nausea with vomiting, unspecified: Secondary | ICD-10-CM

## 2016-12-10 DIAGNOSIS — R197 Diarrhea, unspecified: Secondary | ICD-10-CM | POA: Insufficient documentation

## 2016-12-10 DIAGNOSIS — R1084 Generalized abdominal pain: Secondary | ICD-10-CM | POA: Insufficient documentation

## 2016-12-10 DIAGNOSIS — E860 Dehydration: Secondary | ICD-10-CM | POA: Insufficient documentation

## 2016-12-10 DIAGNOSIS — Z792 Long term (current) use of antibiotics: Secondary | ICD-10-CM | POA: Insufficient documentation

## 2016-12-10 LAB — COMPREHENSIVE METABOLIC PANEL
ALT: 20 U/L (ref 14–54)
AST: 29 U/L (ref 15–41)
Albumin: 5.1 g/dL — ABNORMAL HIGH (ref 3.5–5.0)
Alkaline Phosphatase: 54 U/L (ref 38–126)
Anion gap: 8 (ref 5–15)
BUN: 14 mg/dL (ref 6–20)
CO2: 23 mmol/L (ref 22–32)
Calcium: 9.8 mg/dL (ref 8.9–10.3)
Chloride: 103 mmol/L (ref 101–111)
Creatinine, Ser: 0.91 mg/dL (ref 0.44–1.00)
GFR calc Af Amer: >60 mL/min (ref >60)
GFR calc non Af Amer: >60 mL/min (ref >60)
Glucose, Bld: 124 mg/dL — ABNORMAL HIGH (ref 65–99)
Potassium: 3.9 mmol/L (ref 3.5–5.1)
Sodium: 134 mmol/L — ABNORMAL LOW (ref 135–145)
Total Bilirubin: 1 mg/dL (ref 0.3–1.2)
Total Protein: 8.5 g/dL — ABNORMAL HIGH (ref 6.5–8.1)

## 2016-12-10 LAB — URINALYSIS, ROUTINE W REFLEX MICROSCOPIC
Glucose, UA: NEGATIVE mg/dL
Hgb urine dipstick: NEGATIVE
Ketones, ur: 15 mg/dL — AB
Leukocytes, UA: NEGATIVE
Nitrite: NEGATIVE
Protein, ur: NEGATIVE mg/dL
Specific Gravity, Urine: 1.03 (ref 1.005–1.030)
pH: 5.5 (ref 5.0–8.0)

## 2016-12-10 LAB — CBC
HCT: 43 % (ref 36.0–46.0)
Hemoglobin: 14.6 g/dL (ref 12.0–15.0)
MCH: 28.1 pg (ref 26.0–34.0)
MCHC: 34 g/dL (ref 30.0–36.0)
MCV: 82.7 fL (ref 78.0–100.0)
Platelets: 285 10*3/uL (ref 150–400)
RBC: 5.2 MIL/uL — ABNORMAL HIGH (ref 3.87–5.11)
RDW: 14.3 % (ref 11.5–15.5)
WBC: 11.6 10*3/uL — ABNORMAL HIGH (ref 4.0–10.5)

## 2016-12-10 LAB — PREGNANCY, URINE: Preg Test, Ur: NEGATIVE

## 2016-12-10 LAB — LIPASE, BLOOD: Lipase: 19 U/L (ref 11–51)

## 2016-12-10 MED ORDER — SODIUM CHLORIDE 0.9 % IV BOLUS (SEPSIS)
1000.0000 mL | Freq: Once | INTRAVENOUS | Status: AC
  Administered 2016-12-10: 1000 mL via INTRAVENOUS

## 2016-12-10 MED ORDER — PROMETHAZINE HCL 25 MG RE SUPP: 25.0000 mg | Freq: Four times a day (QID) | RECTAL | 0 refills | Status: DC | PRN

## 2016-12-10 MED ORDER — ONDANSETRON HCL 4 MG/2ML IJ SOLN
4.0000 mg | Freq: Once | INTRAMUSCULAR | Status: AC | PRN
  Administered 2016-12-10: 4 mg via INTRAVENOUS
  Filled 2016-12-10: qty 2

## 2016-12-10 MED ORDER — ONDANSETRON 8 MG PO TBDP: 8.0000 mg | ORAL_TABLET | Freq: Once | ORAL | Status: DC

## 2016-12-10 MED ORDER — ONDANSETRON 8 MG PO TBDP: 8.0000 mg | ORAL_TABLET | Freq: Three times a day (TID) | ORAL | 0 refills | Status: DC | PRN

## 2016-12-10 NOTE — Discharge Instructions (Signed)
Please return to the ER if your symptoms worsen; you have increased pain, fevers, chills, inability to keep any medications down, confusion. °

## 2016-12-10 NOTE — ED Provider Notes (Signed)
[facility]   CSN: [id number] Arrival date & time: 12/10/16  0445     History   Chief Complaint Chief Complaint  Patient presents with  . Emesis    HPI Emily Briggs is a 21 y.o. female.  HPI Pt comes in with cc of n/v. Pt reports that she started having abd cramps, nausea and emesis yday. Pt has had > 10 episodes of emesis for sure, and the emesis is bilious. No blood. Pt has had 2 loose BM in the last 24 hours. Pt's abd pain is lower quadrants.  No uti like symptoms, no vaginal discharge or bleeding.   ROS 10 Systems reviewed and are negative for acute change except as noted in the HPI.     Past Medical History:  Diagnosis Date  . Anxiety   . Bronchial spasm   . Depression     Patient Active Problem List   Diagnosis Date Noted  . Pain, dental 08/10/2016  . Depression with anxiety 08/10/2016    Past Surgical History:  Procedure Laterality Date  . TONSILLECTOMY      OB History    No data available       Home Medications    Prior to Admission medications   Medication Sig Start Date End Date Taking? Authorizing Provider  albuterol (PROVENTIL HFA;VENTOLIN HFA) 108 (90 Base) MCG/ACT inhaler Inhale 2 puffs into the lungs every 6 (six) hours as needed for wheezing or shortness of breath.    Historical Provider, MD  amoxicillin (AMOXIL) 500 MG capsule Take 1 capsule (500 mg total) by mouth 3 (three) times daily. Patient not taking: Reported on 10/24/2016 08/08/16   Henrietta Hoover, NP  etonogestrel (NEXPLANON) 68 MG IMPL implant 1 each by Subdermal route once. 02/2014    Historical Provider, MD  guaiFENesin-dextromethorphan (ROBITUSSIN DM) 100-10 MG/5ML syrup Take 5 mLs by mouth every 4 (four) hours as needed for cough.    Historical Provider, MD  menthol-cetylpyridinium (CEPACOL) 3 MG lozenge Take 1 lozenge by mouth as needed for sore throat.    Historical Provider, MD  ondansetron (ZOFRAN ODT) 8 MG disintegrating tablet Take 1  tablet (8 mg total) by mouth every 8 (eight) hours as needed for nausea. 12/10/16   Derwood Kaplan, MD  promethazine (PHENERGAN) 25 MG suppository Place 1 suppository (25 mg total) rectally every 6 (six) hours as needed for nausea. 12/10/16   Derwood Kaplan, MD    Family History No family history on file.  Social History Social History  Substance Use Topics  . Smoking status: Current Every Day Smoker    Packs/day: 0.50    Types: Cigarettes    Last attempt to quit: 10/31/2011  . Smokeless tobacco: Never Used  . Alcohol use Yes     Comment: occ     Allergies   Sulfa drugs cross reactors; Citalopram; and Prozac [fluoxetine hcl]   Review of Systems Review of Systems   Physical Exam Updated Vital Signs BP 121/63 (BP Location: Right Arm)   Pulse 99   Temp 98.8 F (37.1 C) (Oral)   Resp 14   Ht 5\' 5"  (1.651 m)   Wt 120 lb (54.4 kg)   SpO2 97%   BMI 19.97 kg/m   Physical Exam  Constitutional: She is oriented to person, place, and time. She appears well-developed and well-nourished.  HENT:  Head: Normocephalic and atraumatic.  Eyes: EOM are normal. Pupils are equal, round, and reactive to light.  Neck: Neck supple.  Cardiovascular:  Normal rate, regular rhythm and normal heart sounds.   No murmur heard. Pulmonary/Chest: Effort normal. No respiratory distress.  Abdominal: Soft. She exhibits no distension. There is no tenderness. There is no rebound and no guarding.  Neurological: She is alert and oriented to person, place, and time.  Skin: Skin is warm and dry.  Nursing note and vitals reviewed.    ED Treatments / Results  Labs (all labs ordered are listed, but only abnormal results are displayed) Labs Reviewed  COMPREHENSIVE METABOLIC PANEL - Abnormal; Notable for the following:       Result Value   Sodium 134 (*)    Glucose, Bld 124 (*)    Total Protein 8.5 (*)    Albumin 5.1 (*)    All other components within normal limits  CBC - Abnormal; Notable for the  following:    WBC 11.6 (*)    RBC 5.20 (*)    All other components within normal limits  URINALYSIS, ROUTINE W REFLEX MICROSCOPIC - Abnormal; Notable for the following:    Color, Urine AMBER (*)    APPearance CLOUDY (*)    Bilirubin Urine SMALL (*)    Ketones, ur 15 (*)    All other components within normal limits  LIPASE, BLOOD  PREGNANCY, URINE    EKG  EKG Interpretation None       Radiology No results found.  Procedures Procedures (including critical care time)  Medications Ordered in ED Medications  ondansetron (ZOFRAN-ODT) disintegrating tablet 8 mg (0 mg Oral Hold 12/10/16 0708)  ondansetron (ZOFRAN) injection 4 mg (4 mg Intravenous Given 12/10/16 0513)  sodium chloride 0.9 % bolus 1,000 mL (0 mLs Intravenous Stopped 12/10/16 0602)     Initial Impression / Assessment and Plan / ED Course  I have reviewed the triage vital signs and the nursing notes.  Pertinent labs & imaging results that were available during my care of the patient were reviewed by me and considered in my medical decision making (see chart for details).  Clinical Course as of Dec 10 712  Sat Dec 10, 2016  0710 Pt reassessed. Pt's VSS and WNL. Pt's cap refill < 3 seconds. Pt has been hydrated in the ER and now passed po challenge. We will discharge with antiemetic. Strict ER return precautions have been discussed and pt will return if he is unable to tolerate fluids and symptoms are getting worse.  [AN]    Clinical Course User Index [AN] Derwood KaplanAnkit Lashawne Dura, MD    Pt comes in with cc of nausea, emesis. Pt has abd cramps - pain is generalized and not focal. Pt is slightly dehydrated on exam - po challenge started and bolus of iv fluid given in the ER.   Final Clinical Impressions(s) / ED Diagnoses   Final diagnoses:  Dehydration  Nausea vomiting and diarrhea    New Prescriptions New Prescriptions   ONDANSETRON (ZOFRAN ODT) 8 MG DISINTEGRATING TABLET    Take 1 tablet (8 mg total) by mouth  every 8 (eight) hours as needed for nausea.   PROMETHAZINE (PHENERGAN) 25 MG SUPPOSITORY    Place 1 suppository (25 mg total) rectally every 6 (six) hours as needed for nausea.     Derwood KaplanAnkit Ravan Schlemmer, MD 12/10/16 [id number]

## 2016-12-10 NOTE — ED Triage Notes (Signed)
Pt c/o n/v/d since yesterday. 

## 2016-12-10 NOTE — ED Notes (Signed)
Pt tolerating PO fluids

## 2016-12-10 NOTE — ED Notes (Signed)
Pt verbalizes understanding of d/c instructions and denies any further needs at this time. 

## 2016-12-18 IMAGING — CT CT HEAD W/O CM
2 series · 16 of 30 positions shown, 18 images · non-contrast
Comparison: 01/18/2015

CLINICAL DATA: Weakness.  Blurry vision.

EXAM:
CT HEAD WITHOUT CONTRAST
TECHNIQUE: Contiguous axial images were obtained from the base of the skull
through the vertex without intravenous contrast.

[Series 301: head w/o, idose (1) · axial · non-contrast · 0.49mm/px · z∈[+209,+349]mm · 8 of 36 slices shown, 10 images]
[im 4/36  brain]
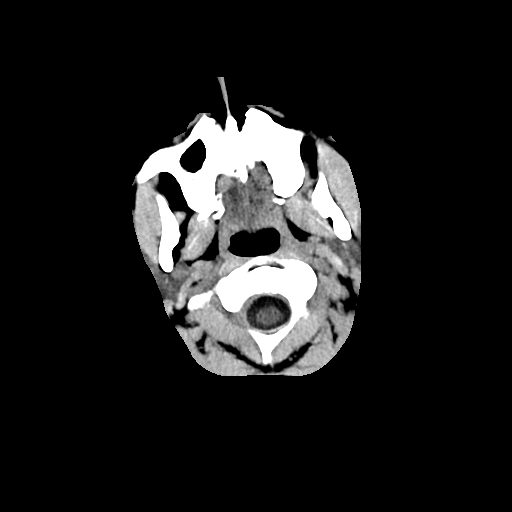
[im 4/36  bone]
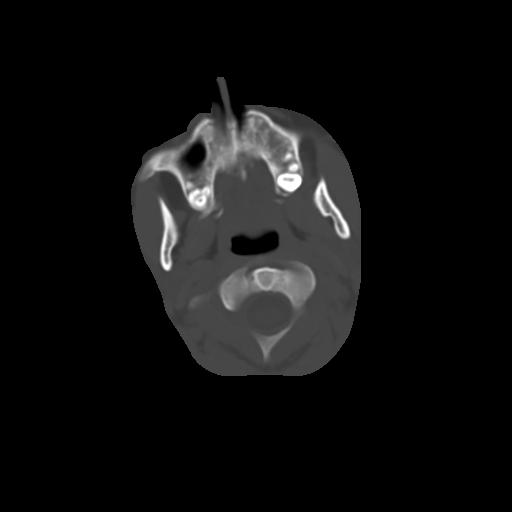
[im 8/36  brain]
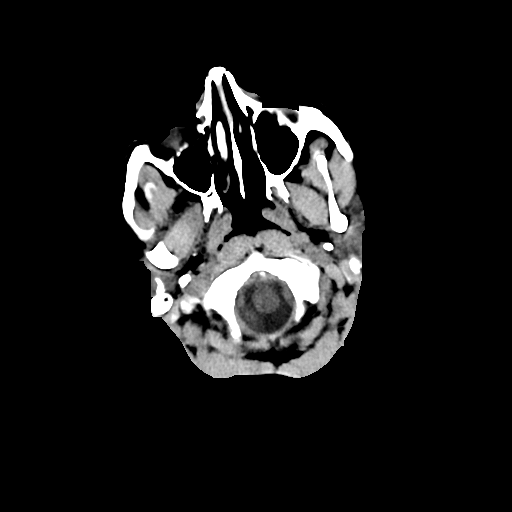
[im 12/36  brain]
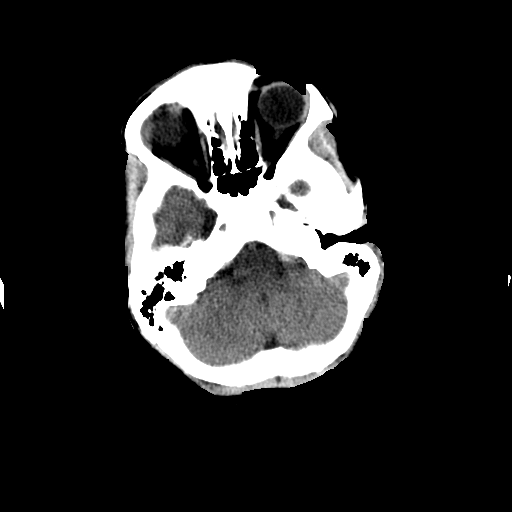
[im 16/36  brain]
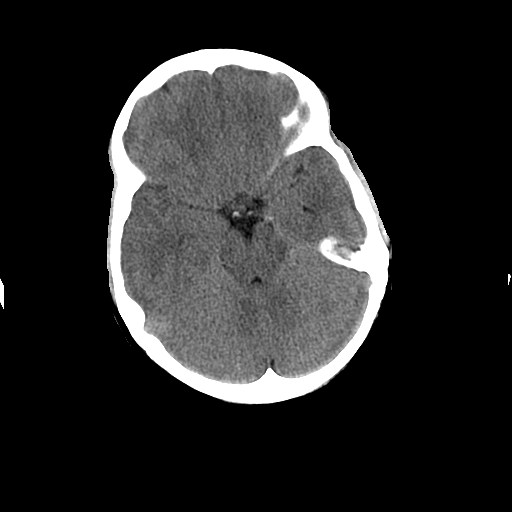
[im 20/36  brain]
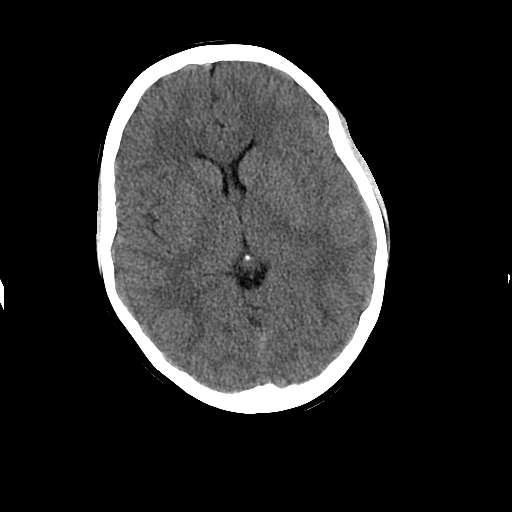
[im 20/36  bone]
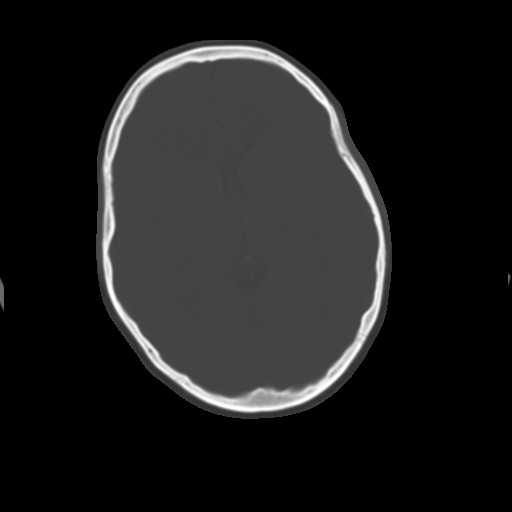
[im 24/36  brain]
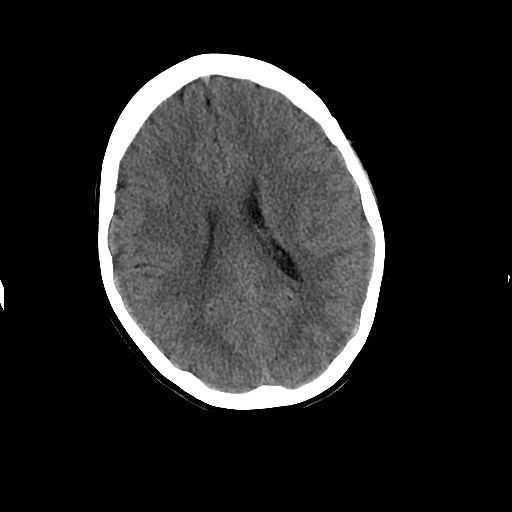
[im 28/36  brain]
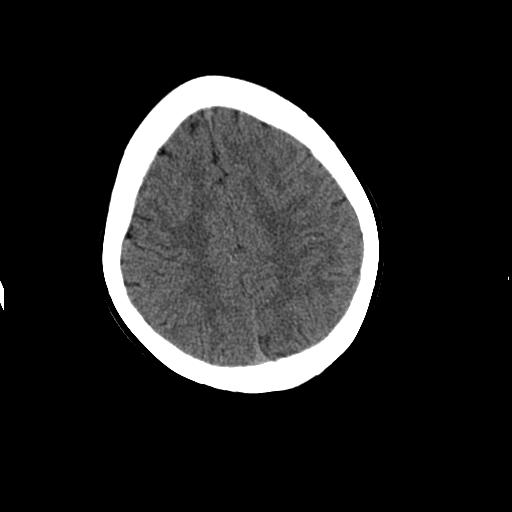
[im 32/36  brain]
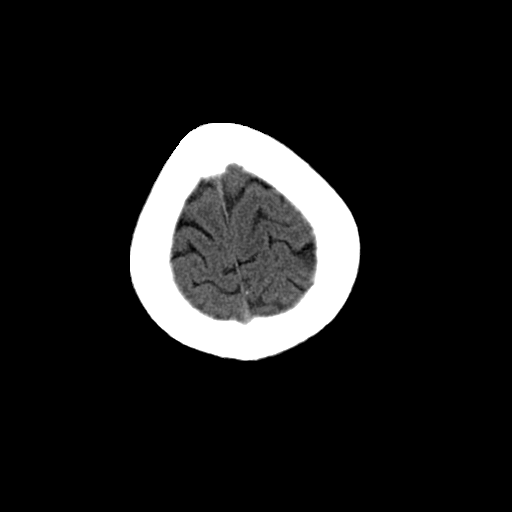

[Series 302: head w/o bone, idose (1) · axial · non-contrast · 0.49mm/px · z∈[+210,+350]mm · 8 of 72 slices shown]
[im 8/72  bone]
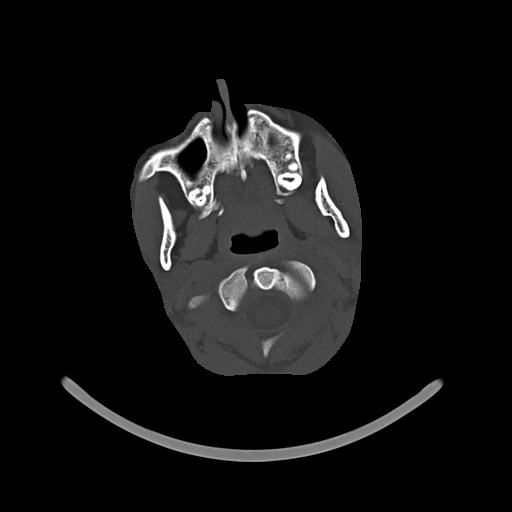
[im 15/72  bone]
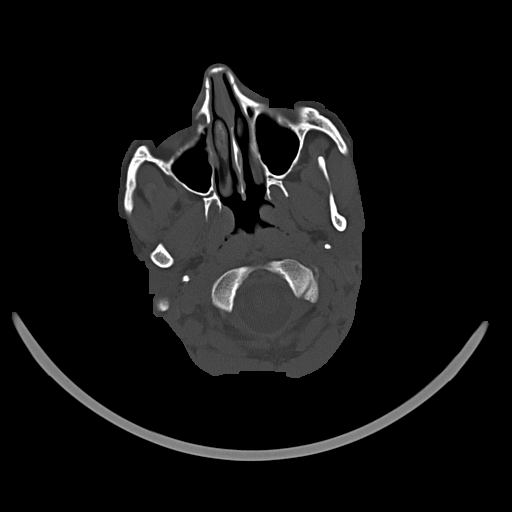
[im 23/72  bone]
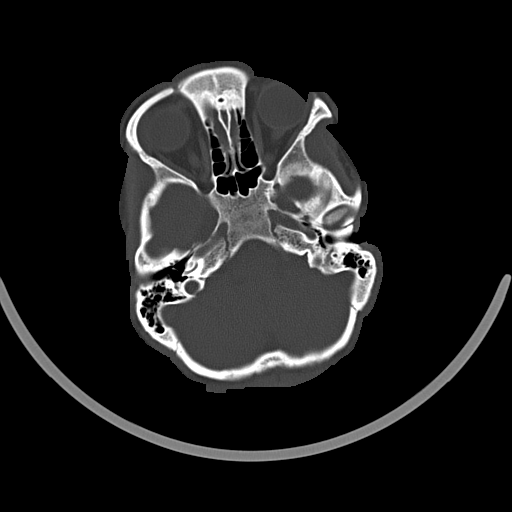
[im 30/72  bone]
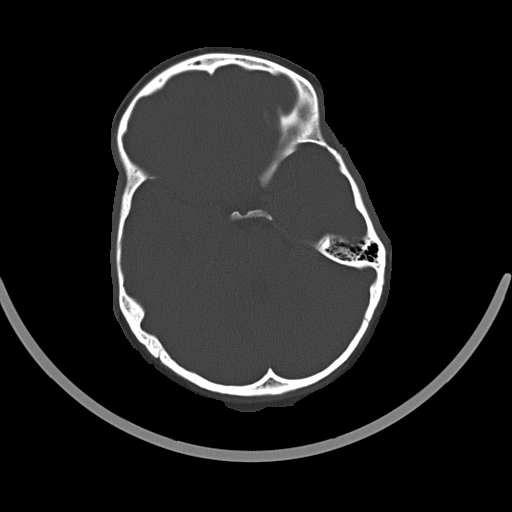
[im 42/72  bone]
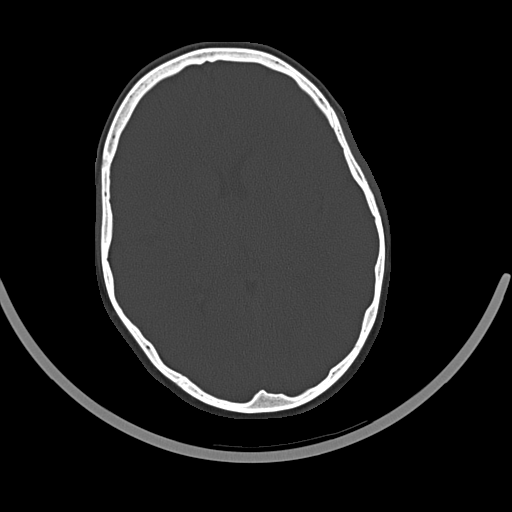
[im 49/72  bone]
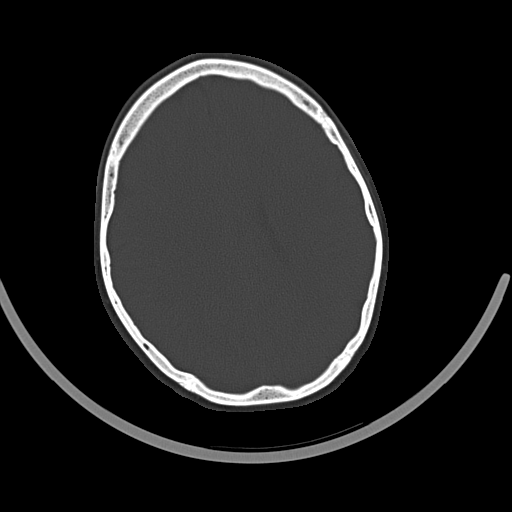
[im 57/72  bone]
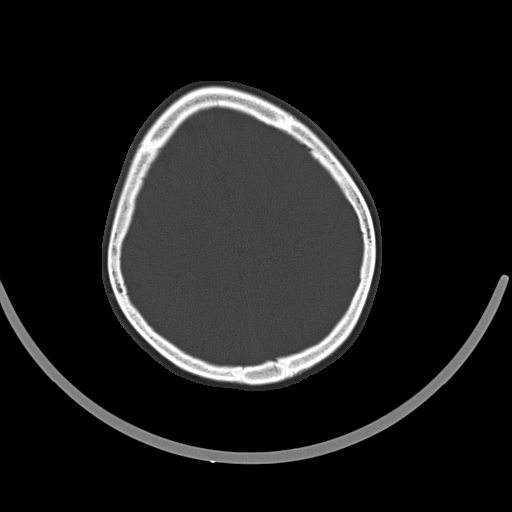
[im 64/72  bone]
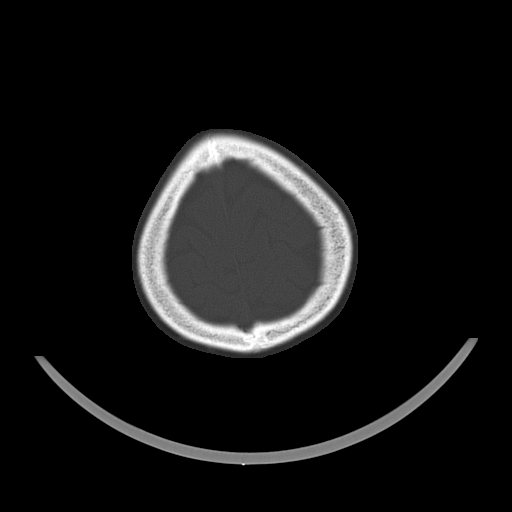

[16 of 30 positions shown; findings below may reference images not displayed]

FINDINGS: No acute cortical infarct, hemorrhage, or mass lesion ispresent.
Ventricles are of normal size. No significant extra-axial fluid
collection is present. The paranasal sinuses andmastoid air cells
are clear. The osseous skull is intact.
IMPRESSION: Normal exam.

## 2017-01-06 ENCOUNTER — Encounter: Payer: PRIVATE HEALTH INSURANCE | Admitting: Obstetrics & Gynecology

## 2017-09-05 ENCOUNTER — Emergency Department (HOSPITAL_BASED_OUTPATIENT_CLINIC_OR_DEPARTMENT_OTHER)
Admission: EM | Admit: 2017-09-05 | Discharge: 2017-09-06 | Disposition: A | Discharge status: Home / Self Care | Payer: PRIVATE HEALTH INSURANCE | Attending: Emergency Medicine | Admitting: Emergency Medicine

## 2017-09-05 ENCOUNTER — Encounter (HOSPITAL_BASED_OUTPATIENT_CLINIC_OR_DEPARTMENT_OTHER): Payer: Self-pay | Admitting: *Deleted

## 2017-09-05 DIAGNOSIS — N898 Other specified noninflammatory disorders of vagina: Secondary | ICD-10-CM | POA: Insufficient documentation

## 2017-09-05 DIAGNOSIS — R21 Rash and other nonspecific skin eruption: Secondary | ICD-10-CM | POA: Insufficient documentation

## 2017-09-05 DIAGNOSIS — F17210 Nicotine dependence, cigarettes, uncomplicated: Secondary | ICD-10-CM | POA: Insufficient documentation

## 2017-09-05 LAB — URINALYSIS, ROUTINE W REFLEX MICROSCOPIC
Bilirubin Urine: NEGATIVE
Glucose, UA: NEGATIVE mg/dL
Hgb urine dipstick: NEGATIVE
Ketones, ur: NEGATIVE mg/dL
Leukocytes, UA: NEGATIVE
Nitrite: NEGATIVE
Protein, ur: NEGATIVE mg/dL
Specific Gravity, Urine: 1.02 (ref 1.005–1.030)
pH: 6 (ref 5.0–8.0)

## 2017-09-05 LAB — PREGNANCY, URINE: Preg Test, Ur: NEGATIVE

## 2017-09-05 NOTE — ED Triage Notes (Signed)
Rash over her body for 2 days that [place]. It looks like mosquito bites. She is her with a vaginal discharge for a month.

## 2017-09-06 LAB — WET PREP, GENITAL
Clue Cells Wet Prep HPF POC: NONE SEEN
Sperm: NONE SEEN
Trich, Wet Prep: NONE SEEN
Yeast Wet Prep HPF POC: NONE SEEN

## 2017-09-06 MED ORDER — DIPHENHYDRAMINE HCL 25 MG PO CAPS
50.0000 mg | ORAL_CAPSULE | Freq: Once | ORAL | Status: AC
  Administered 2017-09-06: 50 mg via ORAL
  Filled 2017-09-06: qty 2

## 2017-09-06 MED ORDER — PREDNISONE 10 MG (21) PO TBPK: ORAL_TABLET | ORAL | 0 refills | Status: DC

## 2017-09-06 MED ORDER — PREDNISONE 50 MG PO TABS
60.0000 mg | ORAL_TABLET | Freq: Once | ORAL | Status: AC
  Administered 2017-09-06: 60 mg via ORAL
  Filled 2017-09-06: qty 1

## 2017-09-06 NOTE — ED Provider Notes (Signed)
TIME SEEN: 12:13 AM  CHIEF COMPLAINT: rash 2 days, vaginal discharge 1 1/2 months  HPI: pPtient is a 21 year old female with no significant past medical history who presents emergency department with diffuse pruritic rash after [place].  She is not sure if she was bitten by something. She denies any new soaps, lotions, detergents, medications or other new exposures. She has been trying Benadryl intermittently and hydrocortisone cream. Rash is on her extremities and torso. Does not involve her palms, soles or face. No fever.   The patient is also complaining of vaginal discharge with foul odor for the past month and a half. No dysuria or hematuria. No abdominal pain. No vomiting or diarrhea. Last menstrual period was September 20. No history of STDs.  ROS: See HPI Constitutional: no fever  Eyes: no drainage  ENT: no runny nose   Cardiovascular:  no chest pain  Resp: no SOB  GI: no vomiting GU: no dysuria Integumentary: no rash  Allergy: no hives  Musculoskeletal: no leg swelling  Neurological: no slurred speech ROS otherwise negative  PAST MEDICAL HISTORY/PAST SURGICAL HISTORY:  Past Medical History:  Diagnosis Date  . Anxiety   . Bronchial spasm   . Depression     MEDICATIONS:  Prior to Admission medications   Medication Sig Start Date End Date Taking? Authorizing Provider  etonogestrel (NEXPLANON) 68 MG IMPL implant 1 each by Subdermal route once. 02/2014   Yes [provider]  albuterol (PROVENTIL HFA;VENTOLIN HFA) 108 (90 Base) MCG/ACT inhaler Inhale 2 puffs into the lungs every 6 (six) hours as needed for wheezing or shortness of breath.    [provider]  amoxicillin (AMOXIL) 500 MG capsule Take 1 capsule (500 mg total) by mouth 3 (three) times daily. Patient not taking: Reported on 10/24/2016 08/08/16   Henrietta HooverBernhardt, Linda C, NP  guaiFENesin-dextromethorphan (ROBITUSSIN DM) 100-10 MG/5ML syrup Take 5 mLs by mouth every  4 (four) hours as needed for cough.    [provider]  menthol-cetylpyridinium (CEPACOL) 3 MG lozenge Take 1 lozenge by mouth as needed for sore throat.    [provider]  ondansetron (ZOFRAN ODT) 8 MG disintegrating tablet Take 1 tablet (8 mg total) by mouth every 8 (eight) hours as needed for nausea. 12/10/16   Derwood KaplanNanavati, Ankit, MD  promethazine (PHENERGAN) 25 MG suppository Place 1 suppository (25 mg total) rectally every 6 (six) hours as needed for nausea. 12/10/16   Derwood KaplanNanavati, Ankit, MD    ALLERGIES:  Allergies  Allergen Reactions  . Sulfa Drugs Cross Reactors Other (See Comments)    "migraine"  . Citalopram Rash and Other (See Comments)    headaches  . Prozac [Fluoxetine Hcl] Rash    Skin pealing     SOCIAL HISTORY:  Social History  Substance Use Topics  . Smoking status: Current Every Day Smoker    Packs/day: 0.50    Types: Cigarettes    Last attempt to quit: 10/31/2011  . Smokeless tobacco: Never Used  . Alcohol use Yes     Comment: occ    FAMILY HISTORY: No family history on file.  EXAM: BP 121/85 (BP Location: Left Arm)   Pulse 88   Temp 98.2 F (36.8 C) (Oral)   Resp 18   Ht 5\' 5"  (1.651 m)   Wt 61.2 kg (135 lb)   LMP 08/03/2017   SpO2 100%   BMI 22.47 kg/m  CONSTITUTIONAL: Alert and oriented and responds appropriately to questions. Well-appearing;  well-nourished HEAD: Normocephalic EYES: Conjunctivae clear, pupils appear equal, EOMI ENT: normal nose; moist mucous membranes NECK: Supple, no meningismus, no nuchal rigidity, no LAD  CARD: RRR; S1 and S2 appreciated; no murmurs, no clicks, no rubs, no gallops RESP: Normal chest excursion without splinting or tachypnea; breath sounds clear and equal bilaterally; no wheezes, no rhonchi, no rales, no hypoxia or respiratory distress, speaking full sentences ABD/GI: Normal bowel sounds; non-distended; soft, non-tender, no rebound, no guarding, no peritoneal signs, no hepatosplenomegaly GU:   Normal external genitalia. No lesions, rashes noted. Patient has no vaginal bleeding on exam. Minimal amount of thin white vaginal discharge.  No adnexal tenderness, mass or fullness, no cervical motion tenderness. Cervix is not appear friable.  Cervix is closed.  Chaperone present for exam. BACK:  The back appears normal and is non-tender to palpation, there is no CVA tenderness EXT: Normal ROM in all joints; non-tender to palpation; no edema; normal capillary refill; no cyanosis, no calf tenderness or swelling    SKIN: Normal color for age and race; warm; erythematous scattered maculopapular rash to the dorsum of the hands and torso without signs of superimposed infection, no petechia or purpura, no hives, no blisters or desquamation, no rash on her palms or mucous membranes NEURO: Moves all extremities equally PSYCH: The patient's mood and manner are appropriate. Grooming and personal hygiene are appropriate.  MEDICAL DECISION MAKING: Patient here with rash that looks like insect bites. She is requesting something else to help with her symptoms. We'll place her on a steroid taper and have her continue Benadryl. No sign of superimposed infection or life-threatening rash today.  Patient also complaining of vaginal discharge. No abdominal pain. No fevers, vomiting, urinary symptoms. Urine shows no sign of infection, pregnancy test negative. Pelvic exam unremarkable. Wet prep positive for white blood cells but no Trichomonas, yeast or clue cells. Gonorrhea and chlamydia test pending. She will be contacted if these are positive and be treated as an outpatient. I do not feel she needs empiric treatment at this time. Based on her benign exam, I doubt PID, ovarian torsion, TOA, appendicitis. I feel she is safe to be discharged home with outpatient follow-up.   At this time, I do not feel there is any life-threatening condition present. I have reviewed and discussed all results (EKG, imaging, lab, urine as  appropriate) and exam findings with patient/family. I have reviewed nursing notes and appropriate previous records.  I feel the patient is safe to be discharged home without further emergent workup and can continue workup as an outpatient as needed. Discussed usual and customary return precautions. Patient/family verbalize understanding and are comfortable with this plan.  Outpatient follow-up has been provided if needed. All questions have been answered.      Parsa Rickett, Layla Maw, DO 09/06/17 0100

## 2017-09-06 NOTE — Discharge Instructions (Signed)
I recommend you use Benadryl 25-50 mg every 8 hours as needed for itching. Please continue your steroid taper until complete.   As for your vaginal discharge, or you have no sign of urinary tract infection. Pregnancy test is negative. Pelvic cultures have thus far been unremarkable. We have tested you for gonorrhea and chlamydia. If these are positive, you'll be contacted and treated.

## 2017-09-07 LAB — GC/CHLAMYDIA PROBE AMP (~~LOC~~) NOT AT ARMC
Chlamydia: NEGATIVE
Neisseria Gonorrhea: NEGATIVE

## 2017-11-19 ENCOUNTER — Other Ambulatory Visit: Payer: Self-pay

## 2017-11-19 ENCOUNTER — Emergency Department (HOSPITAL_BASED_OUTPATIENT_CLINIC_OR_DEPARTMENT_OTHER): Payer: Self-pay

## 2017-11-19 ENCOUNTER — Emergency Department (HOSPITAL_COMMUNITY)
Admission: EM | Admit: 2017-11-19 | Discharge: 2017-11-19 | Discharge status: Against Medical Advice | Payer: PRIVATE HEALTH INSURANCE

## 2017-11-19 ENCOUNTER — Encounter (HOSPITAL_BASED_OUTPATIENT_CLINIC_OR_DEPARTMENT_OTHER): Payer: Self-pay | Admitting: Emergency Medicine

## 2017-11-19 ENCOUNTER — Emergency Department (HOSPITAL_BASED_OUTPATIENT_CLINIC_OR_DEPARTMENT_OTHER)
Admission: EM | Admit: 2017-11-19 | Discharge: 2017-11-19 | Disposition: A | Discharge status: Home / Self Care | Payer: Self-pay | Attending: Emergency Medicine | Admitting: Emergency Medicine

## 2017-11-19 DIAGNOSIS — J069 Acute upper respiratory infection, unspecified: Secondary | ICD-10-CM | POA: Insufficient documentation

## 2017-11-19 DIAGNOSIS — R509 Fever, unspecified: Secondary | ICD-10-CM | POA: Insufficient documentation

## 2017-11-19 DIAGNOSIS — F17210 Nicotine dependence, cigarettes, uncomplicated: Secondary | ICD-10-CM | POA: Insufficient documentation

## 2017-11-19 DIAGNOSIS — M7918 Myalgia, other site: Secondary | ICD-10-CM | POA: Insufficient documentation

## 2017-11-19 DIAGNOSIS — R911 Solitary pulmonary nodule: Secondary | ICD-10-CM | POA: Insufficient documentation

## 2017-11-19 DIAGNOSIS — Z79899 Other long term (current) drug therapy: Secondary | ICD-10-CM | POA: Insufficient documentation

## 2017-11-19 LAB — PREGNANCY, URINE: Preg Test, Ur: NEGATIVE

## 2017-11-19 MED ORDER — BENZONATATE 100 MG PO CAPS: 100.0000 mg | ORAL_CAPSULE | Freq: Three times a day (TID) | ORAL | 0 refills | Status: DC | PRN

## 2017-11-19 MED ORDER — ALBUTEROL SULFATE (2.5 MG/3ML) 0.083% IN NEBU
5.0000 mg | INHALATION_SOLUTION | Freq: Once | RESPIRATORY_TRACT | Status: AC
  Administered 2017-11-19: 5 mg via RESPIRATORY_TRACT
  Filled 2017-11-19: qty 6

## 2017-11-19 MED ORDER — ACETAMINOPHEN 325 MG PO TABS
650.0000 mg | ORAL_TABLET | Freq: Once | ORAL | Status: AC | PRN
  Administered 2017-11-19: 650 mg via ORAL
  Filled 2017-11-19: qty 2

## 2017-11-19 MED ORDER — ALBUTEROL SULFATE HFA 108 (90 BASE) MCG/ACT IN AERS
1.0000 | INHALATION_SPRAY | Freq: Once | RESPIRATORY_TRACT | Status: AC
  Administered 2017-11-19: 1 via RESPIRATORY_TRACT
  Filled 2017-11-19: qty 6.7

## 2017-11-19 MED ORDER — BENZONATATE 100 MG PO CAPS
100.0000 mg | ORAL_CAPSULE | Freq: Once | ORAL | Status: AC
  Administered 2017-11-19: 100 mg via ORAL
  Filled 2017-11-19: qty 1

## 2017-11-19 NOTE — ED Notes (Signed)
Body aches, sneezing x 5 days. Cough x 3 days. Fever onset yesterday. "Coughs so hard that sputum is blood tinged at times" Chest feels tight.

## 2017-11-19 NOTE — ED Provider Notes (Signed)
[facility] EMERGENCY DEPARTMENT Provider Note   CSN: [id number] Arrival date & time: 11/19/17  1552     History   Chief Complaint Chief Complaint  Patient presents with  . Fever  . Cough    HPI Emily Briggs is a 22 y.o. female.  The history is provided by the patient. No language interpreter was used.  Fever   Associated symptoms include cough.  Cough    Emily Briggs is a 22 y.o. female who presents to the Emergency Department complaining of fever, cough.  Resents for evaluation of fever and cough.  She has been feeling poorly for 2 weeks with body aches followed by coughing that started several days ago.  She has associated sneezing.  She has had temperature to 100 at home since yesterday.  She feels burning in her chest is worse with coughing.  She says that her cough is very dry.  No nausea, vomiting, abdominal pain, leg swelling or pain. Past Medical History:  Diagnosis Date  . Anxiety   . Bronchial spasm   . Depression     Patient Active Problem List   Diagnosis Date Noted  . Pain, dental 08/10/2016  . Depression with anxiety 08/10/2016    Past Surgical History:  Procedure Laterality Date  . TONSILLECTOMY      OB History    No data available       Home Medications    Prior to Admission medications   Medication Sig Start Date End Date Taking? Authorizing Provider  albuterol (PROVENTIL HFA;VENTOLIN HFA) 108 (90 Base) MCG/ACT inhaler Inhale 2 puffs into the lungs every 6 (six) hours as needed for wheezing or shortness of breath.    [provider]  benzonatate (TESSALON) 100 MG capsule Take 1 capsule (100 mg total) by mouth 3 (three) times daily as needed for cough. 11/19/17   Tilden Fossa, MD  etonogestrel (NEXPLANON) 68 MG IMPL implant 1 each by Subdermal route once. 02/2014    [provider]  predniSONE (STERAPRED UNI-PAK 21 TAB) 10 MG (21) TBPK tablet Take as directed 09/06/17   Ward, Layla Maw, DO    Family  History No family history on file.  Social History Social History   Tobacco Use  . Smoking status: Current Every Day Smoker    Packs/day: 0.50    Types: Cigarettes    Last attempt to quit: 10/31/2011    Years since quitting: 6.0  . Smokeless tobacco: Never Used  Substance Use Topics  . Alcohol use: Yes    Comment: occ  . Drug use: No     Allergies   Sulfa drugs cross reactors; Citalopram; and Prozac [fluoxetine hcl]   Review of Systems Review of Systems  Constitutional: Positive for fever.  Respiratory: Positive for cough.   All other systems reviewed and are negative.    Physical Exam Updated Vital Signs BP 120/70 (BP Location: Left Arm)   Pulse 98   Temp 98.8 F (37.1 C) (Oral)   Resp 18   Ht 5\' 5"  (1.651 m)   Wt 61.2 kg (135 lb)   LMP 11/04/2017   SpO2 98%   BMI 22.47 kg/m   Physical Exam  Constitutional: She is oriented to person, place, and time. She appears well-developed and well-nourished.  HENT:  Head: Normocephalic and atraumatic.  Scars to TMs bilaterally without any effusions.  Mild erythema in the posterior oropharynx.  Cardiovascular: Regular rhythm.  No murmur heard. Tachycardic  Pulmonary/Chest: Effort normal and breath sounds normal.  No respiratory distress.  Abdominal: Soft. There is no tenderness. There is no rebound and no guarding.  Musculoskeletal: She exhibits no edema or tenderness.  Neurological: She is alert and oriented to person, place, and time.  Skin: Skin is warm and dry.  Psychiatric: She has a normal mood and affect. Her behavior is normal.  Nursing note and vitals reviewed.    ED Treatments / Results  Labs (all labs ordered are listed, but only abnormal results are displayed) Labs Reviewed  PREGNANCY, URINE    EKG  EKG Interpretation None       Radiology Dg Chest 2 View  Result Date: 11/19/2017 CLINICAL DATA:  Fever and body aches with cough x 1 week. Hx bronchiospasm shielded EXAM: CHEST  2 VIEW  COMPARISON:  09/17/2015 and earlier FINDINGS: Cardiomediastinal silhouette is normal. There are no focal consolidations or pleural effusions. There is question of a nodule overlying the right upper lobe measuring 7 mm. Follow-up is recommended. Left lung is clear. No edema. IMPRESSION: 1. Possible pulmonary nodule overlying the right upper lobe. 2. Recommend follow-up chest x-ray in 46 weeks to document persistence. If mass appears persistent, CT of the chest is recommended. Electronically Signed   By: Norva PavlovElizabeth  Brown M.D.   On: 11/19/2017 18:50    Procedures Procedures (including critical care time)  Medications Ordered in ED Medications  acetaminophen (TYLENOL) tablet 650 mg (650 mg Oral Given 11/19/17 2013)  albuterol (PROVENTIL) (2.5 MG/3ML) 0.083% nebulizer solution 5 mg (5 mg Nebulization Given 11/19/17 2150)  benzonatate (TESSALON) capsule 100 mg (100 mg Oral Given 11/19/17 2141)  albuterol (PROVENTIL HFA;VENTOLIN HFA) 108 (90 Base) MCG/ACT inhaler 1 puff (1 puff Inhalation Given 11/19/17 2222)     Initial Impression / Assessment and Plan / ED Course  I have reviewed the triage vital signs and the nursing notes.  Pertinent labs & imaging results that were available during my care of the patient were reviewed by me and considered in my medical decision making (see chart for details).     Patient here for evaluation of fever, cough, body aches.  She is nontoxic appearing on examination with no respiratory distress.  Lungs are clear on exam but she has responded to albuterol the past.  She was given a trial of albuterol the department with symptomatic improvement.  Repeat lung assessments with no significant change compared to priors.  Given patient's improvement in symptoms we will treat with MDI at home to use as needed.  Counseled patient on home care for viral URI with fever.  Discussed outpatient follow-up and return precautions.  Presentation is not consistent with CHF, PE, pneumonia,  sepsis. Final Clinical Impressions(s) / ED Diagnoses   Final diagnoses:  Viral upper respiratory tract infection  Pulmonary nodule    ED Discharge Orders        Ordered    benzonatate (TESSALON) 100 MG capsule  3 times daily PRN     11/19/17 2216       Tilden Fossaees, Lathaniel Legate, MD 11/19/17 2349

## 2017-11-19 NOTE — ED Triage Notes (Signed)
Fever and body aches with cough x 1 week.

## 2017-11-19 NOTE — ED Notes (Signed)
Pt given d/c instructions as per chart. Rx x 1. Verbalizes understanding. No questions. 

## 2017-11-19 NOTE — Discharge Instructions (Signed)
You had a small nodule on your right lung.  You will need to get a repeat x-ray in 4-6 weeks for recheck.

## 2018-09-18 DIAGNOSIS — J309 Allergic rhinitis, unspecified: Secondary | ICD-10-CM | POA: Insufficient documentation

## 2019-04-04 DIAGNOSIS — O321XX0 Maternal care for breech presentation, not applicable or unspecified: Secondary | ICD-10-CM

## 2019-06-29 DIAGNOSIS — K219 Gastro-esophageal reflux disease without esophagitis: Secondary | ICD-10-CM | POA: Insufficient documentation

## 2019-09-09 DIAGNOSIS — J4541 Moderate persistent asthma with (acute) exacerbation: Secondary | ICD-10-CM | POA: Insufficient documentation

## 2020-01-06 DIAGNOSIS — K8020 Calculus of gallbladder without cholecystitis without obstruction: Secondary | ICD-10-CM | POA: Insufficient documentation

## 2020-01-06 DIAGNOSIS — G8929 Other chronic pain: Secondary | ICD-10-CM | POA: Insufficient documentation

## 2020-11-05 ENCOUNTER — Other Ambulatory Visit: Payer: Self-pay

## 2020-11-05 ENCOUNTER — Encounter (HOSPITAL_BASED_OUTPATIENT_CLINIC_OR_DEPARTMENT_OTHER): Payer: Self-pay | Admitting: Emergency Medicine

## 2020-11-05 DIAGNOSIS — J029 Acute pharyngitis, unspecified: Secondary | ICD-10-CM | POA: Diagnosis not present

## 2020-11-05 DIAGNOSIS — F17210 Nicotine dependence, cigarettes, uncomplicated: Secondary | ICD-10-CM | POA: Insufficient documentation

## 2020-11-05 DIAGNOSIS — Z20822 Contact with and (suspected) exposure to covid-19: Secondary | ICD-10-CM | POA: Insufficient documentation

## 2020-11-05 LAB — GROUP A STREP BY PCR: Group A Strep by PCR: NOT DETECTED

## 2020-11-05 NOTE — ED Triage Notes (Signed)
Reports sore throat, recently treated for "bacterial infection" in lungs and finished antibiotics.

## 2020-11-06 ENCOUNTER — Emergency Department (HOSPITAL_BASED_OUTPATIENT_CLINIC_OR_DEPARTMENT_OTHER)
Admission: EM | Admit: 2020-11-06 | Discharge: 2020-11-06 | Disposition: A | Discharge status: Home / Self Care | Payer: Medicaid Other | Attending: Emergency Medicine | Admitting: Emergency Medicine

## 2020-11-06 DIAGNOSIS — J029 Acute pharyngitis, unspecified: Secondary | ICD-10-CM

## 2020-11-06 MED ORDER — PREDNISONE 50 MG PO TABS: 50.0000 mg | ORAL_TABLET | Freq: Every day | ORAL | 0 refills | Status: DC

## 2020-11-06 MED ORDER — BENZONATATE 100 MG PO CAPS: 100.0000 mg | ORAL_CAPSULE | Freq: Three times a day (TID) | ORAL | 0 refills | Status: DC | PRN

## 2020-11-06 MED ORDER — PREDNISONE 50 MG PO TABS
60.0000 mg | ORAL_TABLET | Freq: Once | ORAL | Status: AC
  Administered 2020-11-06: 60 mg via ORAL
  Filled 2020-11-06: qty 1

## 2020-11-06 NOTE — ED Provider Notes (Signed)
[facility] EMERGENCY DEPARTMENT Provider Note   CSN: [id number] Arrival date & time: 11/05/20  2307   History Chief Complaint  Patient presents with  . Sore Throat    Emily Briggs is a 24 y.o. female.  The history is provided by the patient.  Sore Throat  She has history of bronchial infections and comes in because of sore throat.  She had recently completed a course of amoxicillin for what was diagnosed as a bacterial infection in her lungs.  4 days ago, she started complaining of a sore throat.  She saw her dentist today who noted that the throat was irritated and recommended that she be seen.  She was unable to get an appointment at an urgent care center so came here.  She has not had any fever.  She had a cough with her respiratory infection but that has been improving.  She denies arthralgias or myalgias.  Mother is concerned that this might be COVID-19 as she has not been vaccinated but she has had COVID-19 previously.  There have been no known sick contacts.  She is a cigarette smoker, but has cut down to 1 cigarette a day and is vaping.  Past Medical History:  Diagnosis Date  . Anxiety   . Bronchial spasm   . Depression     Patient Active Problem List   Diagnosis Date Noted  . Pain, dental 08/10/2016  . Depression with anxiety 08/10/2016    Past Surgical History:  Procedure Laterality Date  . TONSILLECTOMY       OB History   No obstetric history on file.     No family history on file.  Social History   Tobacco Use  . Smoking status: Current Every Day Smoker    Packs/day: 0.50    Types: Cigarettes    Last attempt to quit: 10/31/2011    Years since quitting: 9.0  . Smokeless tobacco: Never Used  Substance Use Topics  . Alcohol use: Yes    Comment: occ  . Drug use: No    Home Medications Prior to Admission medications   Medication Sig Start Date End Date Taking? Authorizing Provider  albuterol (PROVENTIL HFA;VENTOLIN HFA) 108 (90  Base) MCG/ACT inhaler Inhale 2 puffs into the lungs every 6 (six) hours as needed for wheezing or shortness of breath.    [provider]  benzonatate (TESSALON) 100 MG capsule Take 1 capsule (100 mg total) by mouth 3 (three) times daily as needed for cough. 11/06/20   Dione Booze, MD  etonogestrel (NEXPLANON) 68 MG IMPL implant 1 each by Subdermal route once. 02/2014    [provider]  predniSONE (DELTASONE) 50 MG tablet Take 1 tablet (50 mg total) by mouth daily. 11/06/20   Dione Booze, MD    Allergies    Sulfa drugs cross reactors, Citalopram, and Prozac [fluoxetine hcl]  Review of Systems   Review of Systems  All other systems reviewed and are negative.   Physical Exam Updated Vital Signs BP 136/89 (BP Location: Left Arm)   Pulse 77   Temp 98.2 F (36.8 C) (Oral)   Resp 16   SpO2 99%   Physical Exam Vitals and nursing note reviewed.   24 year old female, resting comfortably and in no acute distress. Vital signs are normal. Oxygen saturation is 99%, which is normal. Head is normocephalic and atraumatic. PERRLA, EOMI. Oropharynx is mildly erythematous without exudate.  There is no pooling of secretions and phonation is normal. Neck is  nontender and supple without adenopathy or JVD. Back is nontender and there is no CVA tenderness. Lungs are clear without rales, wheezes, or rhonchi. Chest is nontender. Heart has regular rate and rhythm without murmur. Abdomen is soft, flat, nontender without masses or hepatosplenomegaly and peristalsis is normoactive. Extremities have no cyanosis or edema, full range of motion is present. Skin is warm and dry without rash. Neurologic: Mental status is normal, cranial nerves are intact, there are no motor or sensory deficits.  ED Results / Procedures / Treatments   Labs (all labs ordered are listed, but only abnormal results are displayed) Labs Reviewed  GROUP A STREP BY PCR  RESP PANEL BY RT-PCR (FLU A&B, COVID) [id number]     Procedures Procedures  Medications Ordered in ED Medications  predniSONE (DELTASONE) tablet 60 mg (60 mg Oral Given 11/06/20 0224)    ED Course  I have reviewed the triage vital signs and the nursing notes.  Pertinent lab results that were available during my care of the patient were reviewed by me and considered in my medical decision making (see chart for details).  MDM Rules/Calculators/A&P Pharyngitis.  Strep PCR is negative, apparently infection is viral.  I have explained this to the patient.  She is requesting prescription for benzonatate because that has helped with cough in the past.  This is given.  She is also put on a short course of prednisone.  Respiratory pathogen panel is sent.  Old records are reviewed, and she has no relevant past visits.  Respiratory pathogen panel was negative for influenza and COVID-19.  Emily Briggs was evaluated in Emergency Department on 11/06/2020 for the symptoms described in the history of present illness. She was evaluated in the context of the global COVID-19 pandemic, which necessitated consideration that the patient might be at risk for infection with the SARS-CoV-2 virus that causes COVID-19. Institutional protocols and algorithms that pertain to the evaluation of patients at risk for COVID-19 are in a state of rapid change based on information released by regulatory bodies including the CDC and federal and state organizations. These policies and algorithms were followed during the patient's care in the ED.  Final Clinical Impression(s) / ED Diagnoses Final diagnoses:  Viral pharyngitis    Rx / DC Orders ED Discharge Orders         Ordered    benzonatate (TESSALON) 100 MG capsule  3 times daily PRN        11/06/20 0220    predniSONE (DELTASONE) 50 MG tablet  Daily        11/06/20 0220           Dione Booze, MD 11/06/20 [phone]

## 2020-11-06 NOTE — Discharge Instructions (Addendum)
Return if symptoms are getting worse.  Look on MyChart for the results of your COVID test.

## 2020-11-07 LAB — RESP PANEL BY RT-PCR (FLU A&B, COVID) ARPGX2
Influenza A by PCR: NEGATIVE
Influenza B by PCR: NEGATIVE
SARS Coronavirus 2 by RT PCR: NEGATIVE

## 2020-11-12 ENCOUNTER — Telehealth (HOSPITAL_COMMUNITY): Payer: Self-pay

## 2021-04-30 ENCOUNTER — Encounter (HOSPITAL_COMMUNITY): Payer: Self-pay | Admitting: Psychiatry

## 2021-04-30 ENCOUNTER — Other Ambulatory Visit: Payer: Self-pay

## 2021-04-30 ENCOUNTER — Ambulatory Visit (INDEPENDENT_AMBULATORY_CARE_PROVIDER_SITE_OTHER): Payer: Medicaid Other | Admitting: Psychiatry

## 2021-04-30 DIAGNOSIS — F4310 Post-traumatic stress disorder, unspecified: Secondary | ICD-10-CM | POA: Diagnosis not present

## 2021-04-30 DIAGNOSIS — F322 Major depressive disorder, single episode, severe without psychotic features: Secondary | ICD-10-CM | POA: Diagnosis not present

## 2021-04-30 DIAGNOSIS — F900 Attention-deficit hyperactivity disorder, predominantly inattentive type: Secondary | ICD-10-CM | POA: Diagnosis not present

## 2021-04-30 DIAGNOSIS — F411 Generalized anxiety disorder: Secondary | ICD-10-CM

## 2021-04-30 HISTORY — DX: Major depressive disorder, single episode, severe without psychotic features: F32.2

## 2021-04-30 MED ORDER — PRAZOSIN HCL 1 MG PO CAPS
1.0000 mg | ORAL_CAPSULE | Freq: Every day | ORAL | 2 refills | Status: DC
Start: 2021-04-30 — End: 2021-07-26

## 2021-04-30 MED ORDER — ATOMOXETINE HCL 40 MG PO CAPS: 40.0000 mg | ORAL_CAPSULE | Freq: Every day | ORAL | 2 refills | Status: DC
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
## 2021-05-04 ENCOUNTER — Other Ambulatory Visit: Payer: Self-pay

## 2021-05-04 ENCOUNTER — Ambulatory Visit: Payer: Medicaid Other | Attending: Nurse Practitioner

## 2021-05-04 DIAGNOSIS — G8929 Other chronic pain: Secondary | ICD-10-CM | POA: Insufficient documentation

## 2021-05-04 DIAGNOSIS — R29898 Other symptoms and signs involving the musculoskeletal system: Secondary | ICD-10-CM | POA: Insufficient documentation

## 2021-05-04 DIAGNOSIS — M6281 Muscle weakness (generalized): Secondary | ICD-10-CM | POA: Diagnosis present

## 2021-05-04 DIAGNOSIS — M545 Low back pain, unspecified: Secondary | ICD-10-CM | POA: Insufficient documentation

## 2021-05-04 NOTE — Patient Instructions (Signed)
Access Code: [email or web address] URL: [email or web address] Date: 05/04/2021 Prepared by: Gardiner Rhyme  Exercises Standing Lumbar Extension with Counter - 2 x daily - 7 x weekly - 1-2 sets - 10 reps Prone Press Up - 1-2 x daily - 7 x weekly - 1-2 sets - 10 reps Supine Posterior Pelvic Tilt - 2 x daily - 7 x weekly - 2 sets - 10 reps Hooklying Clamshell with Resistance - 1 x daily - 7 x weekly - 2 sets - 10 reps - 5 seconds hold
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
## 2021-06-08 ENCOUNTER — Ambulatory Visit (INDEPENDENT_AMBULATORY_CARE_PROVIDER_SITE_OTHER): Payer: Medicaid Other | Admitting: Clinical

## 2021-06-08 ENCOUNTER — Other Ambulatory Visit: Payer: Self-pay

## 2021-06-08 DIAGNOSIS — F332 Major depressive disorder, recurrent severe without psychotic features: Secondary | ICD-10-CM

## 2021-06-08 DIAGNOSIS — F4310 Post-traumatic stress disorder, unspecified: Secondary | ICD-10-CM

## 2021-06-08 DIAGNOSIS — F900 Attention-deficit hyperactivity disorder, predominantly inattentive type: Secondary | ICD-10-CM

## 2021-06-10 ENCOUNTER — Ambulatory Visit (HOSPITAL_COMMUNITY): Payer: Self-pay | Admitting: Psychiatry
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
## 2021-07-05 ENCOUNTER — Other Ambulatory Visit (HOSPITAL_COMMUNITY): Payer: Self-pay | Admitting: Psychiatry

## 2021-07-05 ENCOUNTER — Telehealth (HOSPITAL_COMMUNITY): Payer: Self-pay | Admitting: *Deleted

## 2021-07-05 DIAGNOSIS — F900 Attention-deficit hyperactivity disorder, predominantly inattentive type: Secondary | ICD-10-CM

## 2021-07-05 MED ORDER — ATOMOXETINE HCL 40 MG PO CAPS: 40.0000 mg | ORAL_CAPSULE | Freq: Every day | ORAL | 2 refills | Status: DC

## 2021-07-05 NOTE — Telephone Encounter (Signed)
Medication refilled and sent to preferred pharmacy

## 2021-07-05 NOTE — Telephone Encounter (Signed)
PATIENT CALLED LVM REQUESTING REFILL atomoxetine (STRATTERA) 40 MG capsule

## 2021-07-09 ENCOUNTER — Telehealth (HOSPITAL_COMMUNITY): Payer: Self-pay | Admitting: Psychiatry

## 2021-07-09 ENCOUNTER — Telehealth (HOSPITAL_COMMUNITY): Payer: Self-pay | Admitting: *Deleted

## 2021-07-09 NOTE — Telephone Encounter (Signed)
Walk in stating she has been calling and not called back. Her concern is she is taking 40 mg Of Strattera and she doesn't think its helping and wants to know if she can increase it, she has not tried to do so on her own. Explained provider doesn't work fri pm, will give her the information and ask her to contact patient Monday with recommendations. States Rolley Sims is expensive and she cant buy things she doesn't need or work. She is on Cymbalta 60 mg thru PCP. She has an appt with provider on 08/02/21.

## 2021-07-09 NOTE — Telephone Encounter (Signed)
Patient presented in office stating she's been trying to contact provider about Strattera. She states that her medication has not been helping her. Writer then got nurse staff to speak with patient about medications. Writer also informed patient that she is welcome to come in during providers walk-in hours Monday and Tuesday 8am-11am.

## 2021-07-13 NOTE — Telephone Encounter (Signed)
Provider attempted to call patient twice on 07/12/2021 as well as twice on 07/13/2021 without success.  Patient does not have a voicemail set up the provider was unable to leave a message.  If patient calls back please inform her of provider's walk-in hours which are Monday and Tuesdays between the hours of 8 AM and 10:30 AM.

## 2021-07-13 NOTE — Telephone Encounter (Signed)
Provider attempted to call the patient twice on 07/12/2021 without success.  Provider also tried to call patient on 07/13/2020 without success.  Patient's voicemail not set up so provider unable to leave a message.  Patient can follow-up with provider during walk-in hours on Mondays and Tuesdays between the hours of 8 AM and 10:30 AM.  She can also attempt to call the clinic again and speak with provider.

## 2021-07-26 ENCOUNTER — Telehealth (HOSPITAL_COMMUNITY): Payer: Self-pay | Admitting: *Deleted

## 2021-07-26 ENCOUNTER — Other Ambulatory Visit (HOSPITAL_COMMUNITY): Payer: Self-pay | Admitting: Psychiatry

## 2021-07-26 DIAGNOSIS — F4310 Post-traumatic stress disorder, unspecified: Secondary | ICD-10-CM

## 2021-07-26 DIAGNOSIS — F900 Attention-deficit hyperactivity disorder, predominantly inattentive type: Secondary | ICD-10-CM

## 2021-07-26 MED ORDER — ATOMOXETINE HCL 40 MG PO CAPS: 40.0000 mg | ORAL_CAPSULE | Freq: Every day | ORAL | 2 refills | Status: DC

## 2021-07-26 MED ORDER — PRAZOSIN HCL 1 MG PO CAPS: 1.0000 mg | ORAL_CAPSULE | Freq: Every day | ORAL | 2 refills | Status: DC

## 2021-07-26 NOTE — Telephone Encounter (Signed)
Patient LVM stating that she needes refills I don't see any med's listed under provider last seen as a new patient 6-17

## 2021-07-26 NOTE — Telephone Encounter (Signed)
Provider filled prazosin and Strattera and sent it to [facility].  Patient missed her last appointment and will need to follow-up with provider for further medication refills.

## 2021-08-02 ENCOUNTER — Telehealth (HOSPITAL_COMMUNITY): Payer: Medicaid Other | Admitting: Psychiatry

## 2021-08-04 ENCOUNTER — Ambulatory Visit (HOSPITAL_COMMUNITY): Payer: Medicaid Other | Admitting: Clinical

## 2021-08-19 ENCOUNTER — Ambulatory Visit (HOSPITAL_COMMUNITY): Payer: Self-pay | Admitting: Clinical

## 2022-03-03 ENCOUNTER — Other Ambulatory Visit (HOSPITAL_COMMUNITY)
Admission: RE | Admit: 2022-03-03 | Discharge: 2022-03-03 | Disposition: A | Discharge status: Home / Self Care | Payer: Medicaid Other | Source: Ambulatory Visit | Attending: Advanced Practice Midwife | Admitting: Advanced Practice Midwife

## 2022-03-03 ENCOUNTER — Ambulatory Visit (INDEPENDENT_AMBULATORY_CARE_PROVIDER_SITE_OTHER): Payer: Medicaid Other | Admitting: Advanced Practice Midwife

## 2022-03-03 ENCOUNTER — Encounter: Payer: Self-pay | Admitting: Advanced Practice Midwife

## 2022-03-03 VITALS — BP 115/74 | HR 65 | Wt 164.0 lb

## 2022-03-03 DIAGNOSIS — F418 Other specified anxiety disorders: Secondary | ICD-10-CM

## 2022-03-03 DIAGNOSIS — Z01419 Encounter for gynecological examination (general) (routine) without abnormal findings: Secondary | ICD-10-CM | POA: Insufficient documentation

## 2022-03-03 DIAGNOSIS — Z975 Presence of (intrauterine) contraceptive device: Secondary | ICD-10-CM | POA: Diagnosis not present

## 2022-03-03 DIAGNOSIS — F900 Attention-deficit hyperactivity disorder, predominantly inattentive type: Secondary | ICD-10-CM | POA: Diagnosis not present

## 2022-03-03 DIAGNOSIS — G43009 Migraine without aura, not intractable, without status migrainosus: Secondary | ICD-10-CM

## 2022-03-03 NOTE — Progress Notes (Signed)
NGYN patient presents for Annual Exam. ? ?LMP: first week of April  ?Contraception: Nexplanon due out in June 2023 per pt  ?STD Screening: Declined. ?Family Hx of Breast Cancer: None ?Last pap: unsure > 3 yrs  ? ?CC: pt believes birth control may be effecting hormones.pt notes increase in mood swings and migraines . ?Recently stopped Anxiety and ADHD Rx's. ?Has over active bladder seeing urologist. ? ?Bumps on breast that keep recurring.  ? ? ? ?

## 2022-03-05 NOTE — Progress Notes (Signed)
? ? ?GYNECOLOGY ANNUAL PREVENTATIVE CARE ENCOUNTER NOTE ? ?History:    ? Emily Briggs is a 26 y.o. G73P0011 female here for a routine annual gynecologic exam.  Current complaints: Patient reports continuous use of either Implanon or Nexplanon since age 44. She is due to have her Nexplanon removed in June 2023 and believes it is causing migraines, mood swings.  She is scheduled to have an outpatient consult for her migraines in [facility].  ? ?Patient reports skin lesions on her nipples. She states she has had them since puberty. She picks them. She denies tenderness, bleeding, purulent drainage. She asked her Dermatologist what they were and states her Derm told her "to ask OB/GYN".  ? ? Patient previously discontinued her prescribed Cymbalta and Strattera around December 2022. She "went cold [place]" and feels this was a positive change for her.  ? ?Denies abnormal vaginal bleeding, discharge, pelvic pain, problems with intercourse or other gynecologic concerns.  ?  ?Gynecologic History ?Patient's last menstrual period was 02/15/2022 (approximate). ?Contraception: Nexplanon ?Last Pap: > 3 years ago. Result was normal with negative HPV (per patient report) ? ?Obstetric History ?OB History  ?Gravida Para Term Preterm AB Living  ?2       1 1   ?SAB IAB Ectopic Multiple Live Births  ?1          ?  ?# Outcome Date GA Lbr Len/2nd Weight Sex Delivery Anes PTL Lv  ?2 Gravida           ?1 SAB           ? ? ?Past Medical History:  ?Diagnosis Date  ? Anxiety   ? Bronchial spasm   ? Depression   ? ? ?Past Surgical History:  ?Procedure Laterality Date  ? TONSILLECTOMY    ? ? ?Current Outpatient Medications on File Prior to Visit  ?Medication Sig Dispense Refill  ? etonogestrel (NEXPLANON) 68 MG IMPL implant 1 each by Subdermal route once. 02/2014    ? albuterol (PROVENTIL HFA;VENTOLIN HFA) 108 (90 Base) MCG/ACT inhaler Inhale 2 puffs into the lungs every 6 (six) hours as needed for wheezing or shortness of breath. (Patient not  taking: Reported on 05/04/2021)    ? atomoxetine (STRATTERA) 40 MG capsule Take 1 capsule (40 mg total) by mouth daily. (Patient not taking: Reported on 03/03/2022) 30 capsule 2  ? benzonatate (TESSALON) 100 MG capsule Take 1 capsule (100 mg total) by mouth 3 (three) times daily as needed for cough. (Patient not taking: Reported on 05/04/2021) 21 capsule 0  ? DULoxetine (CYMBALTA) 60 MG capsule Take 60 mg by mouth daily. (Patient not taking: Reported on 03/03/2022)    ? prazosin (MINIPRESS) 1 MG capsule Take 1 capsule (1 mg total) by mouth at bedtime. (Patient not taking: Reported on 03/03/2022) 30 capsule 2  ? predniSONE (DELTASONE) 50 MG tablet Take 1 tablet (50 mg total) by mouth daily. (Patient not taking: Reported on 05/04/2021) 5 tablet 0  ? ?No current facility-administered medications on file prior to visit.  ? ? ?Allergies  ?Allergen Reactions  ? Sulfa Drugs Cross Reactors Other (See Comments)  ?  "migraine"  ? Citalopram Rash and Other (See Comments)  ?  headaches  ? Prozac [Fluoxetine Hcl] Rash  ?  Skin pealing   ? ? ?Social History:  reports that she quit smoking about 10 years ago. Her smoking use included cigarettes. She smoked an average of .5 packs per day. She has never used smokeless tobacco. She reports current alcohol  use. She reports that she does not use drugs. ? ?History reviewed. No pertinent family history. ? ?The following portions of the patient's history were reviewed and updated as appropriate: allergies, current medications, past family history, past medical history, past social history, past surgical history and problem list. ? ?Review of Systems ?Pertinent items noted in HPI and remainder of comprehensive ROS otherwise negative. ? ?Physical Exam:  ?BP 115/74   Pulse 65   Wt 164 lb (74.4 kg)   LMP 02/15/2022 (Approximate)   BMI 27.29 kg/m?  ?CONSTITUTIONAL: Well-developed, well-nourished female in no acute distress.  ?HENT:  Normocephalic, atraumatic, External right and left ear normal.   ?EYES: Conjunctivae and EOM are normal. Pupils are equal, round, and reactive to light. No scleral icterus.  ?NECK: Normal range of motion, supple, no masses.  Normal thyroid.  ?SKIN: Skin is warm and dry. No rash noted. Not diaphoretic. No erythema. No pallor. ?MUSCULOSKELETAL: Normal range of motion. No tenderness.  No cyanosis, clubbing, or edema. ?NEUROLOGIC: Alert and oriented to person, place, and time. Normal reflexes, muscle tone coordination.  ?PSYCHIATRIC: Normal mood and affect. Normal behavior. Normal judgment and thought content. ?CARDIOVASCULAR: Normal heart rate noted, regular rhythm ?RESPIRATORY: Clear to auscultation bilaterally. Effort and breath sounds normal, no problems with respiration noted. ?BREASTS: Symmetric in size. No masses, tenderness, skin changes, nipple drainage, or lymphadenopathy bilaterally. Performed in the presence of a chaperone. "Bumps" identified by patient are c/w Montgomery's tubercles.  ?ABDOMEN: Soft, no distention noted.  No tenderness, rebound or guarding.  ?PELVIC: Normal appearing external genitalia and urethral meatus; normal appearing vaginal mucosa and cervix.  No abnormal vaginal discharge noted.  Pap smear obtained.  Normal uterine size, no other palpable masses, no uterine or adnexal tenderness.  Performed in the presence of a chaperone. ?  ?Assessment and Plan:  ?  1. Well woman exam with routine gynecological exam ?- Reassured Montgomery's tubercles are normal. Encouraged to stop picking to avoid creating vector for infection ?- Cytology - PAP ? ?2. Nexplanon in place ?- Dorsal surface of left upper arm ?- Offered to remove today, patient declines due to work schedule ? ?3. Depression with anxiety ?- Currently unmedicated, no SI, HI, IPV ? ?4. Attention deficit hyperactivity disorder (ADHD), predominantly inattentive type ? ? ?5. Migraine without aura and without status migrainosus, not intractable ?- Already scheduled with Neurology ? ?Will follow up results  of pap smear and manage accordingly. ?Please refer to After Visit Summary for other counseling recommendations.  ?   ? ?Clayton Bibles, MSA, MSN, CNM ?Certified Nurse Midwife, Faculty Practice ?Center for [employer], [facility] ? ? ?

## 2022-03-09 LAB — CYTOLOGY - PAP: Diagnosis: NEGATIVE

## 2022-05-12 ENCOUNTER — Ambulatory Visit (INDEPENDENT_AMBULATORY_CARE_PROVIDER_SITE_OTHER): Payer: Medicaid Other | Admitting: Advanced Practice Midwife

## 2022-05-12 ENCOUNTER — Encounter: Payer: Self-pay | Admitting: Advanced Practice Midwife

## 2022-05-12 VITALS — BP 123/78 | HR 76 | Wt 154.0 lb

## 2022-05-12 DIAGNOSIS — Z3046 Encounter for surveillance of implantable subdermal contraceptive: Secondary | ICD-10-CM | POA: Diagnosis not present

## 2022-05-12 DIAGNOSIS — Z319 Encounter for procreative management, unspecified: Secondary | ICD-10-CM | POA: Diagnosis not present

## 2022-05-12 MED ORDER — VITAFOL GUMMIES 3.33-0.333-34.8 MG PO CHEW: 1.0000 | CHEWABLE_TABLET | Freq: Every day | ORAL | 3 refills | Status: AC

## 2022-05-12 NOTE — Progress Notes (Signed)
Patient presents for Nexplanon Removal.   Pt wants to discuss trying to conceive.

## 2022-05-12 NOTE — Progress Notes (Signed)
     GYNECOLOGY OFFICE PROCEDURE NOTE  Emily Briggs is a 26 y.o. G2P0011 here for Nexplanon removal.  Last pap smear was on 03/03/2022 and was normal.  No other gynecologic concerns. Patient desires pregnancy  Nexplanon Removal Patient identified, informed consent performed, consent signed.   Appropriate time out taken. Nexplanon site identified.  Area prepped in usual sterile fashon. One ml of 1% lidocaine was used to anesthetize the area at the distal end of the implant. A small stab incision was made right beside the implant on the distal portion.  The Nexplanon rod was grasped using hemostats and removed without difficulty.  There was minimal blood loss. There were no complications.  3 ml of 1% lidocaine was injected around the incision for post-procedure analgesia.  Steri-strips were applied over the small incision.  A pressure bandage was applied to reduce any bruising.  The patient tolerated the procedure well and was given post procedure instructions.  Patient desires pregnancy and so declines alternate contraception  TTC: discussed recommendation for PNV with folic acid x 3 months before TTC. S/p Nexplanon removal expect return of menstrual cycle in 4-6 weeks S/p Neuro evaluation for chronic migraine. New rx for medication management as of this morning. Encouraged patient to notify her Neurology care team that she is trying to conceive   RTC as needed for pregnancy confirmation.  Clayton Bibles, MSA, MSN, CNM Certified Nurse Midwife, Biochemist, clinical for [employer], [facility]

## 2022-05-26 ENCOUNTER — Telehealth: Payer: Self-pay

## 2022-05-26 NOTE — Telephone Encounter (Signed)
Return call to pt regarding triage message. Pt requested UTI Rx c/o burning w/ urination and urgency pt requested if Sam CNM would call in Rx Request denied pt needs to F/U as directed with urologist offcie that requested she come in to see them and leave a sample.pt made aware and voiced understanding.

## 2022-09-27 ENCOUNTER — Ambulatory Visit (INDEPENDENT_AMBULATORY_CARE_PROVIDER_SITE_OTHER): Payer: Medicaid Other

## 2022-09-27 ENCOUNTER — Ambulatory Visit (INDEPENDENT_AMBULATORY_CARE_PROVIDER_SITE_OTHER): Payer: Medicaid Other | Admitting: *Deleted

## 2022-09-27 VITALS — BP 112/75 | HR 83 | Wt 157.0 lb

## 2022-09-27 DIAGNOSIS — O3680X0 Pregnancy with inconclusive fetal viability, not applicable or unspecified: Secondary | ICD-10-CM

## 2022-09-27 DIAGNOSIS — O34219 Maternal care for unspecified type scar from previous cesarean delivery: Secondary | ICD-10-CM | POA: Insufficient documentation

## 2022-09-27 DIAGNOSIS — O0990 Supervision of high risk pregnancy, unspecified, unspecified trimester: Secondary | ICD-10-CM

## 2022-09-27 MED ORDER — PROMETHAZINE HCL 25 MG PO TABS: 25.0000 mg | ORAL_TABLET | Freq: Four times a day (QID) | ORAL | 2 refills | Status: AC | PRN

## 2022-09-27 MED ORDER — DOXYLAMINE-PYRIDOXINE 10-10 MG PO TBEC: 2.0000 | DELAYED_RELEASE_TABLET | Freq: Every day | ORAL | 5 refills | Status: AC

## 2022-09-27 NOTE — Progress Notes (Signed)
New OB Intake  I explained I am completing New OB Intake today. We discussed her EDD of 05/22/23 that is based on todays scan. LMP of approx 08/17/22-pt states she had a few days of sprotting. Pt is G3/P1. I reviewed her allergies, medications, Medical/Surgical/OB history, and appropriate screenings.   Patient Active Problem List   Diagnosis Date Noted   Previous cesarean delivery affecting pregnancy 09/27/2022   Supervision of high risk pregnancy, antepartum 09/27/2022   Attention deficit hyperactivity disorder (ADHD), predominantly inattentive type 04/30/2021   PTSD (post-traumatic stress disorder) 04/30/2021   Generalized anxiety disorder 04/30/2021   Depression with anxiety 08/10/2016    Concerns addressed today  Delivery Plans:  Plans to deliver at [facility].     Anatomy US Explained first scheduled [place] will be around 19 weeks.    Placed OB Box on problem list and updated   Patient informed that the ultrasound is considered a limited obstetric ultrasound and is not intended to be a complete ultrasound exam.  Patient also informed that the ultrasound is not being completed with the intent of assessing for fetal or placental anomalies or any pelvic abnormalities. Explained that the purpose of today's ultrasound is to assess for dating and fetal heart rate.  Patient acknowledges the purpose of the exam and the limitations of the study.      First visit review  Will have pt come back in 2 weeks for repeat scan due to HX of SAB. Then return in 5 weeks for New OB visit.   I reviewed new OB appt with pt. Explained pt will be seen by Thalia Bloodgood, CNM at first visit.    Scheryl Marten, RN 09/27/2022  12:17 PM

## 2022-10-12 ENCOUNTER — Ambulatory Visit (INDEPENDENT_AMBULATORY_CARE_PROVIDER_SITE_OTHER): Payer: Medicaid Other

## 2022-10-12 ENCOUNTER — Ambulatory Visit (INDEPENDENT_AMBULATORY_CARE_PROVIDER_SITE_OTHER): Payer: Medicaid Other | Admitting: *Deleted

## 2022-10-12 VITALS — BP 110/74 | HR 80

## 2022-10-12 DIAGNOSIS — O0990 Supervision of high risk pregnancy, unspecified, unspecified trimester: Secondary | ICD-10-CM

## 2022-10-12 MED ORDER — TERCONAZOLE 0.8 % VA CREA: 1.0000 | TOPICAL_CREAM | Freq: Every day | VAGINAL | 0 refills | Status: DC

## 2022-10-12 NOTE — Progress Notes (Addendum)
Pt here today for her repeat scan. Denies any concerns, other than having a yeast infection from being on antibiotics from an ear infection.   Today's scan consistent with early done with EDD of 05/22/2023.      Delivery Plans:  Plans to deliver at  [facility].   MyChart/Babyscripts MyChart access verified. I explained pt will have some visits in office and some virtually. Babyscripts app discussed and ordered.   Blood Pressure Cuff  BP cuff  given  Discussed to be used for virtual visits and or if needed BP checks weekly.    Anatomy US Explained first scheduled [place] will be around 19 weeks.   Labs Discussed Avelina Laine genetic screening with patient. Would like both Panorama and Horizon drawn at new OB visit. Routine prenatal labs needed.   Placed OB Box on problem list and updated   Patient informed that the ultrasound is considered a limited obstetric ultrasound and is not intended to be a complete ultrasound exam.  Patient also informed that the ultrasound is not being completed with the intent of assessing for fetal or placental anomalies or any pelvic abnormalities. Explained that the purpose of today's ultrasound is to assess for dating and fetal heart rate.  Patient acknowledges the purpose of the exam and the limitations of the study.      RX sent in for yeast infection.    First visit review I reviewed new OB appt with pt. I explained she will have ob bloodwork with genetic screening. Explained pt will be seen by Dr Para March at first visit.    Scheryl Marten, RN 10/12/2022  11:26 AM

## 2022-10-18 ENCOUNTER — Other Ambulatory Visit: Payer: Self-pay | Admitting: *Deleted

## 2022-10-18 MED ORDER — TERCONAZOLE 0.8 % VA CREA: 1.0000 | TOPICAL_CREAM | Freq: Every day | VAGINAL | 1 refills | Status: DC

## 2022-10-20 ENCOUNTER — Telehealth: Payer: Self-pay | Admitting: *Deleted

## 2022-10-20 ENCOUNTER — Encounter: Payer: Self-pay | Admitting: Obstetrics and Gynecology

## 2022-10-20 NOTE — Telephone Encounter (Signed)
-----   Message from Alvin Critchley sent at 10/20/2022  9:46 AM EST ----- Regarding: Nurse Call Pt called in to speak to the nurse. She states she went to urgent care and was diagnosed with bronchitis. They prescribed her steroids, but she wants to know if its safe before picking it up, she doesn't see it on her medication list. Pt also states she has possible yeast due to taking amoxicillin.

## 2022-10-20 NOTE — Telephone Encounter (Signed)
Left message stating steroids are safe in pregnancy and she can use monistat 7 day over the counter for yeast and to call back if she has any other concerns.

## 2022-10-27 ENCOUNTER — Encounter: Payer: Medicaid Other | Admitting: Advanced Practice Midwife

## 2022-11-01 ENCOUNTER — Encounter: Payer: Medicaid Other | Admitting: Obstetrics and Gynecology

## 2022-11-16 ENCOUNTER — Encounter: Payer: Self-pay | Admitting: Family Medicine

## 2022-11-16 ENCOUNTER — Other Ambulatory Visit (HOSPITAL_COMMUNITY)
Admission: RE | Admit: 2022-11-16 | Discharge: 2022-11-16 | Disposition: A | Discharge status: Home / Self Care | Payer: Medicaid Other | Source: Ambulatory Visit | Attending: Advanced Practice Midwife | Admitting: Advanced Practice Midwife

## 2022-11-16 ENCOUNTER — Ambulatory Visit (INDEPENDENT_AMBULATORY_CARE_PROVIDER_SITE_OTHER): Payer: Medicaid Other | Admitting: Family Medicine

## 2022-11-16 VITALS — BP 99/68 | HR 83 | Temp 98.2°F | Wt 156.0 lb

## 2022-11-16 DIAGNOSIS — O0990 Supervision of high risk pregnancy, unspecified, unspecified trimester: Secondary | ICD-10-CM | POA: Diagnosis present

## 2022-11-16 DIAGNOSIS — Z3A13 13 weeks gestation of pregnancy: Secondary | ICD-10-CM

## 2022-11-16 DIAGNOSIS — O0993 Supervision of high risk pregnancy, unspecified, third trimester: Secondary | ICD-10-CM

## 2022-11-16 DIAGNOSIS — O99341 Other mental disorders complicating pregnancy, first trimester: Secondary | ICD-10-CM | POA: Diagnosis not present

## 2022-11-16 DIAGNOSIS — G43109 Migraine with aura, not intractable, without status migrainosus: Secondary | ICD-10-CM

## 2022-11-16 DIAGNOSIS — O34219 Maternal care for unspecified type scar from previous cesarean delivery: Secondary | ICD-10-CM | POA: Diagnosis not present

## 2022-11-16 DIAGNOSIS — F411 Generalized anxiety disorder: Secondary | ICD-10-CM | POA: Diagnosis not present

## 2022-11-16 NOTE — Progress Notes (Signed)
Subjective:   Emily Briggs is a 27 y.o. G3P1011 at [redacted]w[redacted]d by LMP, early ultrasound being seen today for her first obstetrical visit.  Her obstetrical history is significant for smoker and previous c-section . Pregnancy history fully reviewed.  Patient reports  URI sx's. Tested positive for influenza B .  HISTORY: OB History  Gravida Para Term Preterm AB Living  3 1 1  0 1 1  SAB IAB Ectopic Multiple Live Births  1 0 0 0 1    # Outcome Date GA Lbr Len/2nd Weight Sex Delivery Anes PTL Lv  3 Current           2 Term 04/04/19 [redacted]w[redacted]d  7 lb 1 oz (3.204 kg) M CS-LTranv  N LIV     Complications: Breech birth  1 SAB 2019           Last pap smear was  02/2022 and was normal Past Medical History:  Diagnosis Date   Anxiety    Bronchial spasm    Current severe episode of major depressive disorder without psychotic features without prior episode ([place]) 04/30/2021   Depression    Migraines    Past Surgical History:  Procedure Laterality Date   CESAREAN SECTION     DILATION AND CURETTAGE OF UTERUS     TONSILLECTOMY     WISDOM TOOTH EXTRACTION     Family History  Problem Relation Age of Onset   Anxiety disorder Mother    Osteoporosis Mother    COPD Father    Atrial fibrillation Father    Seizures Father    Hypertension Maternal Grandmother    Cancer Paternal Grandmother        Brain   Social History   Tobacco Use   Smoking status: Former    Packs/day: 0.50    Types: Cigarettes    Quit date: 10/31/2011    Years since quitting: 11.0   Smokeless tobacco: Never  Vaping Use   Vaping Use: Every day  Substance Use Topics   Alcohol use: Not Currently    Comment: occ   Drug use: No   Allergies  Allergen Reactions   Sulfa Drugs Cross Reactors Other (See Comments)    "migraine"   Citalopram Rash and Other (See Comments)    headaches   Prozac [Fluoxetine Hcl] Rash    Skin pealing    Current Outpatient Medications on File Prior to Visit  Medication Sig Dispense Refill    Doxylamine-Pyridoxine (DICLEGIS) 10-10 MG TBEC Take 2 tablets by mouth at bedtime. If symptoms persist, add one tablet in the morning and one in the afternoon 100 tablet 5   Prenatal Vit-Fe Phos-FA-Omega (VITAFOL GUMMIES) 3.33-0.333-34.8 MG CHEW Chew 1 each by mouth daily. 90 tablet 3   promethazine (PHENERGAN) 25 MG tablet Take 1 tablet (25 mg total) by mouth every 6 (six) hours as needed for nausea or vomiting. 30 tablet 2   terconazole (TERAZOL 3) 0.8 % vaginal cream Place 1 applicator vaginally at bedtime. Apply nightly for three nights. 20 g 1   No current facility-administered medications on file prior to visit.     Exam   Vitals:   11/16/22 0936  BP: 99/68  Pulse: 83  Temp: 98.2 F (36.8 C)  Weight: 156 lb (70.8 kg)   Fetal Heart Rate (bpm): 152  System: General: well-developed, well-nourished female in no acute distress   Skin: normal coloration and turgor, no rashes   Neurologic: oriented, normal, negative, normal mood   Extremities:  normal strength, tone, and muscle mass, ROM of all joints is normal   HEENT PERRLA, extraocular movement intact and sclera clear, anicteric   Mouth/Teeth mucous membranes moist, pharynx normal without lesions and dental hygiene good   Neck supple and no masses   Cardiovascular: regular rate and rhythm   Respiratory:  no respiratory distress, normal breath sounds   Abdomen: soft, non-tender; bowel sounds normal; no masses,  no organomegaly     Assessment:   Pregnancy: G3P1011 Patient Active Problem List   Diagnosis Date Noted   Migraine headache with aura 11/16/2022   Previous cesarean delivery affecting pregnancy 09/27/2022   Supervision of high risk pregnancy, antepartum 09/27/2022   Attention deficit hyperactivity disorder (ADHD), predominantly inattentive type 04/30/2021   PTSD (post-traumatic stress disorder) 04/30/2021   Generalized anxiety disorder 04/30/2021   Cholelithiasis 01/06/2020   Chronic neck pain 01/06/2020    Moderate persistent asthma with acute exacerbation 09/09/2019   Gastroesophageal reflux disease 06/29/2019   Allergic rhinitis 09/18/2018   Depression with anxiety 08/10/2016     Plan:  1. Previous cesarean delivery affecting pregnancy Desires repeat--> will need at 39 weeks  2. Supervision of high risk pregnancy, antepartum New OB labs today - CBC/D/Plt+RPR+Rh+ABO+RubIgG... - Culture, OB Urine - PANORAMA PRENATAL TEST FULL PANEL - HORIZON CUSTOM - Urine cytology ancillary only - Ambulatory referral to Genetics - [place] MFM OB DETAIL +14 [place]; Future  3. Generalized anxiety disorder Declines meds at this time Discussed relationship to migraines - Ambulatory referral to [place]  4. Migraine with aura and without status migrainosus, not intractable To see Santiago Glad    Initial labs drawn. Continue prenatal vitamins. Genetic Screening discussed, NIPS: ordered. Ultrasound discussed; fetal anatomic survey: ordered. Problem list reviewed and updated. The nature of [place] with multiple MDs and other Advanced Practice Providers was explained to patient; also emphasized that residents, students are part of our team. Routine obstetric precautions reviewed. Return in about 4 weeks (around 12/14/2022) for see Santiago Glad.

## 2022-11-16 NOTE — Assessment & Plan Note (Signed)
Desires RCS.

## 2022-11-16 NOTE — Progress Notes (Signed)
NEED TO GET B/P NOB [redacted]w[redacted]d EDD: 05/22/23  Last Pap: 03/03/22 WNL  Genetic Screening:Desires and wants to know Gender Flu Vaccine:pt will wait getting over cold etc wants to wait nv.  CC: Mild cramping, low grade fever.   98.2 today in the office. Per pt tested Neg for Covid. + Influenza B yesterday at [facility].   Has no desire to eat and nausea and vomiting and fatigue.

## 2022-11-17 LAB — CBC/D/PLT+RPR+RH+ABO+RUBIGG...
Antibody Screen: NEGATIVE
Basophils Absolute: 0 10*3/uL (ref 0.0–0.2)
Basos: 0 %
EOS (ABSOLUTE): 0.1 10*3/uL (ref 0.0–0.4)
Eos: 1 %
HCV Ab: NONREACTIVE
HIV Screen 4th Generation wRfx: NONREACTIVE
Hematocrit: 39.1 % (ref 34.0–46.6)
Hemoglobin: 12.8 g/dL (ref 11.1–15.9)
Hepatitis B Surface Ag: NEGATIVE
Immature Grans (Abs): 0 10*3/uL (ref 0.0–0.1)
Immature Granulocytes: 0 %
Lymphocytes Absolute: 2.2 10*3/uL (ref 0.7–3.1)
Lymphs: 38 %
MCH: 27.2 pg (ref 26.6–33.0)
MCHC: 32.7 g/dL (ref 31.5–35.7)
MCV: 83 fL (ref 79–97)
Monocytes Absolute: 0.4 10*3/uL (ref 0.1–0.9)
Monocytes: 6 %
Neutrophils Absolute: 3.1 10*3/uL (ref 1.4–7.0)
Neutrophils: 55 %
Platelets: 226 10*3/uL (ref 150–450)
RBC: 4.7 x10E6/uL (ref 3.77–5.28)
RDW: 13.4 % (ref 11.7–15.4)
RPR Ser Ql: NONREACTIVE
Rh Factor: POSITIVE
Rubella Antibodies, IGG: 6.06 index (ref >0.99)
WBC: 5.8 10*3/uL (ref 3.4–10.8)

## 2022-11-17 LAB — URINE CYTOLOGY ANCILLARY ONLY
Chlamydia: NEGATIVE
Comment: NEGATIVE
Comment: NORMAL
Neisseria Gonorrhea: NEGATIVE

## 2022-11-17 LAB — HCV INTERPRETATION

## 2022-11-18 LAB — CULTURE, OB URINE

## 2022-11-18 LAB — URINE CULTURE, OB REFLEX

## 2022-11-21 LAB — PANORAMA PRENATAL TEST FULL PANEL:PANORAMA TEST PLUS 5 ADDITIONAL MICRODELETIONS: FETAL FRACTION: 9.5

## 2022-11-25 LAB — HORIZON CUSTOM: REPORT SUMMARY: POSITIVE — AB

## 2022-11-28 ENCOUNTER — Encounter: Payer: Self-pay | Admitting: Family Medicine

## 2022-11-28 DIAGNOSIS — D563 Thalassemia minor: Secondary | ICD-10-CM | POA: Insufficient documentation

## 2022-12-14 ENCOUNTER — Encounter: Payer: Medicaid Other | Admitting: Family Medicine

## 2022-12-26 ENCOUNTER — Ambulatory Visit: Payer: Medicaid Other | Attending: Family Medicine

## 2022-12-26 ENCOUNTER — Ambulatory Visit: Payer: Medicaid Other

## 2022-12-30 ENCOUNTER — Institutional Professional Consult (permissible substitution): Payer: Medicaid Other | Admitting: Physician Assistant

## 2023-01-11 ENCOUNTER — Encounter: Payer: Medicaid Other | Admitting: Family Medicine

## 2023-04-07 ENCOUNTER — Inpatient Hospital Stay (HOSPITAL_COMMUNITY)
Admission: AD | Admit: 2023-04-07 | Discharge: 2023-04-08 | Disposition: A | Discharge status: Home / Self Care | Payer: Medicaid Other | Attending: Obstetrics & Gynecology | Admitting: Obstetrics & Gynecology

## 2023-04-07 ENCOUNTER — Encounter (HOSPITAL_COMMUNITY): Payer: Self-pay | Admitting: Obstetrics & Gynecology

## 2023-04-07 ENCOUNTER — Other Ambulatory Visit: Payer: Self-pay

## 2023-04-07 DIAGNOSIS — M25559 Pain in unspecified hip: Secondary | ICD-10-CM | POA: Insufficient documentation

## 2023-04-07 DIAGNOSIS — M549 Dorsalgia, unspecified: Secondary | ICD-10-CM | POA: Diagnosis not present

## 2023-04-07 DIAGNOSIS — O99891 Other specified diseases and conditions complicating pregnancy: Secondary | ICD-10-CM | POA: Diagnosis not present

## 2023-04-07 DIAGNOSIS — O26893 Other specified pregnancy related conditions, third trimester: Secondary | ICD-10-CM | POA: Diagnosis present

## 2023-04-07 DIAGNOSIS — Z3A33 33 weeks gestation of pregnancy: Secondary | ICD-10-CM | POA: Insufficient documentation

## 2023-04-07 DIAGNOSIS — Z3689 Encounter for other specified antenatal screening: Secondary | ICD-10-CM

## 2023-04-07 DIAGNOSIS — R110 Nausea: Secondary | ICD-10-CM | POA: Insufficient documentation

## 2023-04-07 LAB — WET PREP, GENITAL
Clue Cells Wet Prep HPF POC: NONE SEEN
Sperm: NONE SEEN
Trich, Wet Prep: NONE SEEN
WBC, Wet Prep HPF POC: >=10 — AB (ref <10)
Yeast Wet Prep HPF POC: NONE SEEN

## 2023-04-07 MED ORDER — CYCLOBENZAPRINE HCL 5 MG PO TABS
10.0000 mg | ORAL_TABLET | Freq: Once | ORAL | Status: AC
  Administered 2023-04-07: 10 mg via ORAL
  Filled 2023-04-07: qty 2

## 2023-04-07 MED ORDER — PROMETHAZINE HCL 25 MG PO TABS
25.0000 mg | ORAL_TABLET | Freq: Once | ORAL | Status: AC
  Administered 2023-04-07: 25 mg via ORAL
  Filled 2023-04-07: qty 1

## 2023-04-07 NOTE — MAU Provider Note (Signed)
History     CSN: [id number]  Arrival date and time: 04/07/23 2216   Event Date/Time   First Provider Initiated Contact with Patient 04/07/23 2253      Chief Complaint  Patient presents with   Contractions   Emily Briggs is a 27 y.o. G3P1011 at [redacted]w[redacted]d who receives care at [facility].  She reports she had an appt today and is scheduled for another in 2 weeks.   She presents today, via EMS with SO Emily Briggs, for contractions, back and hip pain.  She states she has been experiencing symptoms for the past week, but that they have worsened today.  She states she was told, by her primary ob, that her symptoms was r/t a UTI.  She was prescribed Macrobid, but has not obtained the prescription.  She reports fetal movement and denies vaginal concerns. Patient reports the back and hip pain is sharp and is worse with walking.  She rates the pain a 8-9/10.  Patient reports a history of symptoms pubis pain and was seeing a physical therapist.  However she reports she felt she was not improving and stopped treatments.  Patient also reports nausea, diarrhea and dysuria.   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   Depression    Migraines     Past Surgical History:  Procedure Laterality Date   CESAREAN SECTION     DILATION AND CURETTAGE OF UTERUS     TONSILLECTOMY     WISDOM TOOTH EXTRACTION      Family History  Problem Relation Age of Onset   Anxiety disorder Mother    Osteoporosis Mother    COPD Father    Atrial fibrillation Father    Seizures Father    Hypertension Maternal Grandmother    Cancer Paternal Grandmother        Brain    Social History    Tobacco Use   Smoking status: Former    Packs/day: .5    Types: Cigarettes    Quit date: 10/31/2011    Years since quitting: 11.4   Smokeless tobacco: Never  Vaping Use   Vaping Use: Every day  Substance Use Topics   Alcohol use: Not Currently    Comment: occ   Drug use: No    Allergies:  Allergies  Allergen Reactions   Kiwi Extract Anaphylaxis    Kiwi the fruit    Sulfa Drugs Cross Reactors Other (See Comments)    "migraine"   Citalopram Rash and Other (See Comments)    headaches   Prozac [Fluoxetine Hcl] Rash    Skin pealing     Medications Prior to Admission  Medication Sig Dispense Refill Last Dose   Doxylamine-Pyridoxine (DICLEGIS) 10-10 MG TBEC Take 2 tablets by mouth at bedtime. If symptoms persist, add one tablet in the morning and one in the afternoon 100 tablet 5    Prenatal Vit-Fe Phos-FA-Omega (VITAFOL GUMMIES) 3.33-0.333-34.8  MG CHEW Chew 1 each by mouth daily. 90 tablet 3    promethazine (PHENERGAN) 25 MG tablet Take 1 tablet (25 mg total) by mouth every 6 (six) hours as needed for nausea or vomiting. 30 tablet 2    terconazole (TERAZOL 3) 0.8 % vaginal cream Place 1 applicator vaginally at bedtime. Apply nightly for three nights. 20 g 1     Review of Systems  Constitutional:  Positive for fever (Reports 100.2 last night around 2100).  Gastrointestinal:  Positive for diarrhea (Loose/Watery; unsure how often) and nausea (x2 days). Negative for vomiting.  Genitourinary:  Positive for dysuria (One week) and pelvic pain (Reports pubic symphisis issues). Negative for difficulty urinating, vaginal bleeding and vaginal discharge.  Musculoskeletal:  Positive for back pain ("like muscle spasm sharp through tailbone.").       Hip pain "excruciating" 8/10.  Neurological:  Positive for dizziness, light-headedness and headaches ([place], "heavy" tylenol w/o relief 4-5/10).   Physical Exam   Blood pressure 107/70, pulse (!) 123, temperature 98.2 F (36.8 C), resp.  rate 20, last menstrual period 08/17/2022, SpO2 98 %.  Physical Exam Vitals reviewed. Exam conducted with a chaperone present.  Constitutional:      Appearance: Normal appearance.  HENT:     Head: Normocephalic and atraumatic.  Eyes:     Conjunctiva/sclera: Conjunctivae normal.  Cardiovascular:     Rate and Rhythm: Normal rate.  Pulmonary:     Effort: Pulmonary effort is normal. No respiratory distress.  Abdominal:     Palpations: Abdomen is soft.     Tenderness: There is no abdominal tenderness.     Comments: Gravid--fundal height appears AGA, Soft, NT   Genitourinary:    General: Normal vulva.     Comments: Speculum Exam: -Normal External Genitalia: Non tender, scant amt watery gray discharge at introitus.  -Vaginal Vault: Pink mucosa with good rugae. Moderate amt white gritty looking discharge -wet prep collected -Cervix:Pink, no lesions, cysts, or polyps.  Appears closed. No active bleeding from os-GC/CT collected -Bimanual Exam: Dilation: Closed Effacement (%): Thick Station: Ballotable Exam by:: Gerrit Heck, CNM   Musculoskeletal:        General: Normal range of motion.     Cervical back: Normal range of motion.  Skin:    General: Skin is warm.  Neurological:     Mental Status: She is alert and oriented to person, place, and time.  Psychiatric:        Mood and Affect: Mood normal.        Behavior: Behavior normal.     Fetal Assessment 135 bpm, Mod Var, -Decels, +15x15Accels Toco: No irritability  MAU Course   Results for orders placed or performed during the hospital encounter of 04/07/23 (from the past 24 hour(s))  Wet prep, genital     Status: Abnormal   Collection Time: 04/07/23 11:10 PM   Specimen: Vaginal  Result Value Ref Range   Yeast Wet Prep HPF POC NONE SEEN NONE SEEN   Trich, Wet Prep NONE SEEN NONE SEEN   Clue Cells Wet Prep HPF POC NONE SEEN NONE SEEN   WBC, Wet Prep HPF POC >=10 (A) <10   Sperm NONE SEEN    No results  found.  MDM PE Labs: Wet prep, GC/CT EFM Muscle relaxant Antiemetic Assessment and Plan  27 year old G3P1011  SIUP at 33.4 weeks Cat I FT Hip Pain Back Pain Nausea  -POC Reviewed. -Exam performed and findings discussed. -Offered and accepts pain medication. -Will give  flexeril and heating pad for back/hip pain. -Phenergan for nausea. -NST reactive. -Cultures collected. -Reassured of closed cervix. -Monitor and reassess.  -Patient reports urine specimen dropped in toilet.   Cherre Robins MSN, CNM 04/07/2023, 10:53 PM   Reassessment (12:31 AM)  -Patient reports that pain is improved, but still present with movement.  -Patient offered and agrees to flexeril. Rx sent to pharmacy on file.  -Discussed usage of tylenol pm for assistance with sleep and pain. -Patient without questions. -Precautions reviewed. -Follow up as scheduled. -Encouraged to call primary office or return to MAU if symptoms worsen or with the onset of new symptoms. -Discharged to home in stable condition.  Cherre Robins MSN, CNM Advanced Practice Provider, [facility] [employer]

## 2023-04-07 NOTE — MAU Note (Signed)
.  Emily Briggs is a 27 y.o. at [redacted]w[redacted]d here in MAU reporting: Pt reports cramping all week. Pt reports she was at her OB office today and was told that she probably has a UTI. She was given a prescription and has not started it yet. Pt reports her family called EMS because they were concerned that her cramping was real ctx's and that the patient was just being stubborn. Denies vaginal bleeding or LOF.  Reports +FM   Onset of complaint: this week  Pain score: 7/10 There were no vitals filed for this visit.    Lab orders placed from triage:   ua

## 2023-04-08 DIAGNOSIS — Z3A33 33 weeks gestation of pregnancy: Secondary | ICD-10-CM

## 2023-04-08 DIAGNOSIS — M549 Dorsalgia, unspecified: Secondary | ICD-10-CM

## 2023-04-08 DIAGNOSIS — O99891 Other specified diseases and conditions complicating pregnancy: Secondary | ICD-10-CM

## 2023-04-08 MED ORDER — CYCLOBENZAPRINE HCL 10 MG PO TABS: 10.0000 mg | ORAL_TABLET | Freq: Two times a day (BID) | ORAL | 0 refills | Status: AC | PRN

## 2023-04-11 LAB — GC/CHLAMYDIA PROBE AMP (~~LOC~~) NOT AT ARMC
Chlamydia: NEGATIVE
Comment: NEGATIVE
Comment: NORMAL
Neisseria Gonorrhea: NEGATIVE
